# Patient Record
Sex: Female | Born: 1975 | Race: Black or African American | Hispanic: No | Marital: Married
Health system: Southern US, Community
[De-identification: ages and names within clinical notes are randomized; demographics above are authoritative.]

---

## 2008-08-10 ENCOUNTER — Ambulatory Visit (HOSPITAL_COMMUNITY): Admission: RE | Admit: 2008-08-10 | Discharge: 2008-08-10 | Payer: Self-pay | Admitting: Obstetrics and Gynecology

## 2008-08-31 ENCOUNTER — Ambulatory Visit (HOSPITAL_COMMUNITY): Admission: RE | Admit: 2008-08-31 | Discharge: 2008-08-31 | Payer: Self-pay | Admitting: Obstetrics and Gynecology

## 2008-09-28 ENCOUNTER — Ambulatory Visit (HOSPITAL_COMMUNITY): Admission: RE | Admit: 2008-09-28 | Discharge: 2008-09-28 | Payer: Self-pay | Admitting: Obstetrics and Gynecology

## 2008-10-27 ENCOUNTER — Ambulatory Visit (HOSPITAL_COMMUNITY): Admission: RE | Admit: 2008-10-27 | Discharge: 2008-10-27 | Payer: Self-pay | Admitting: Obstetrics and Gynecology

## 2008-11-24 ENCOUNTER — Ambulatory Visit (HOSPITAL_COMMUNITY): Admission: RE | Admit: 2008-11-24 | Discharge: 2008-11-24 | Payer: Self-pay | Admitting: Obstetrics and Gynecology

## 2010-08-23 IMAGING — US US OB FOLLOW-UP
1 series · 14 of 28 positions shown · non-contrast
Comparison: none

OBSTETRICAL ULTRASOUND:
 This ultrasound was performed in The [HOSPITAL], and the AS OB/GYN report will be stored to [REDACTED] PACS.

[Series 1: us ob follow-up · 14 of 50 slices shown]
[im 2/50]
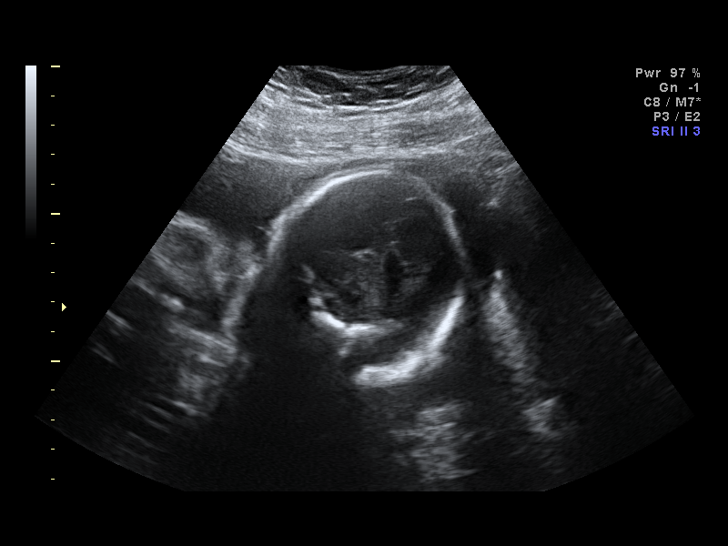
[im 6/50]
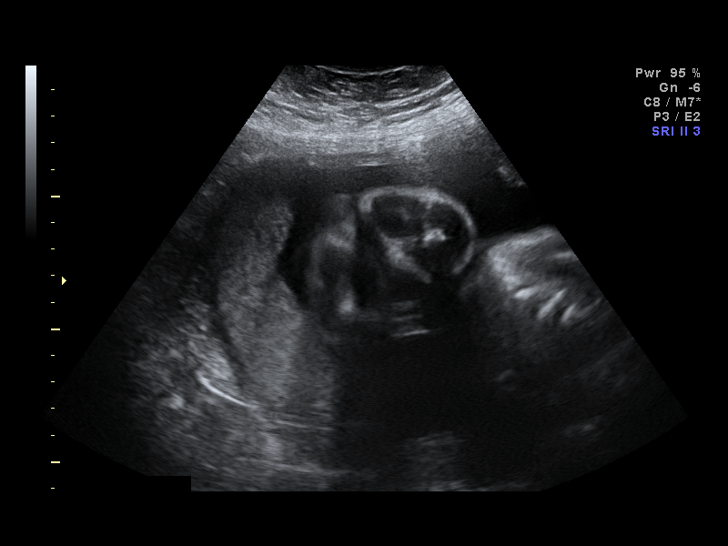
[im 10/50]
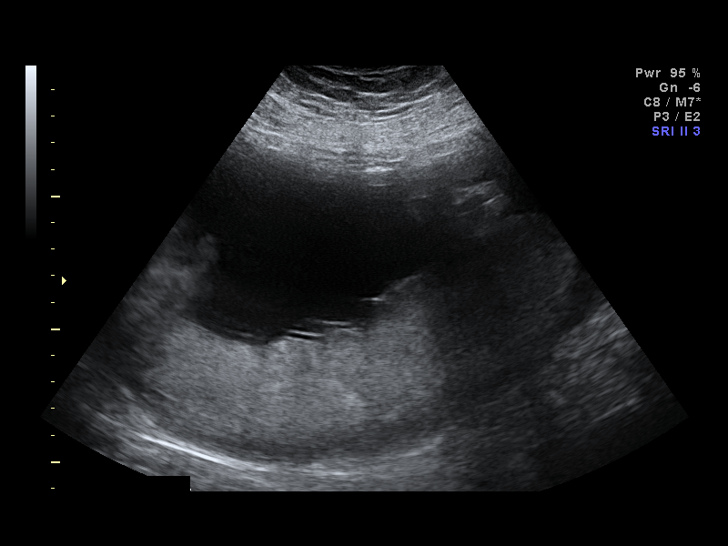
[im 13/50]
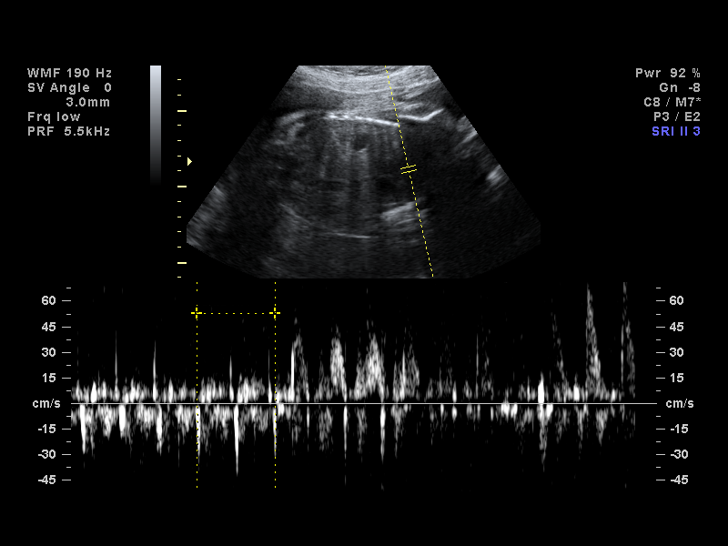
[im 17/50]
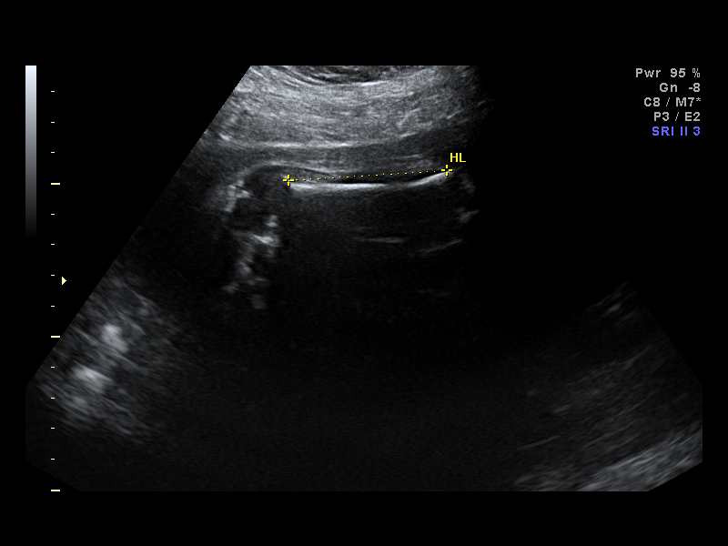
[im 20/50]
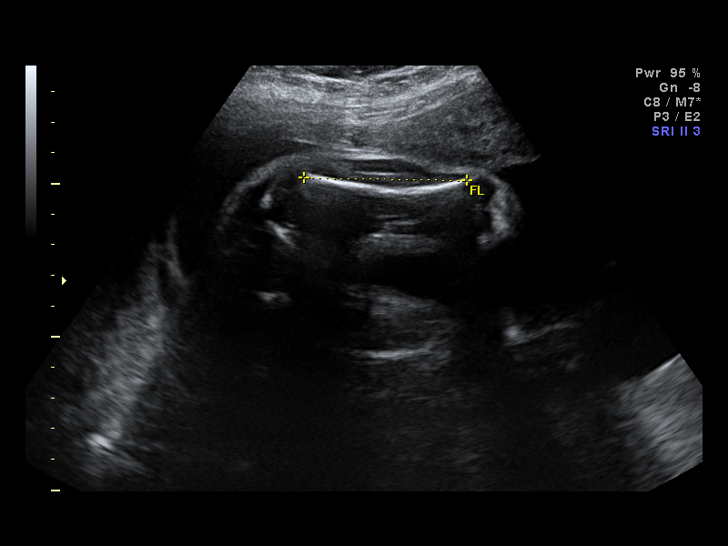
[im 24/50]
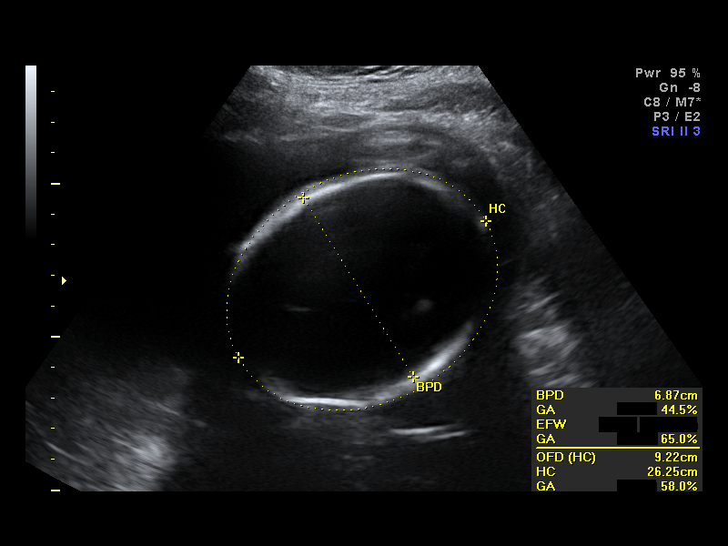
[im 28/50]
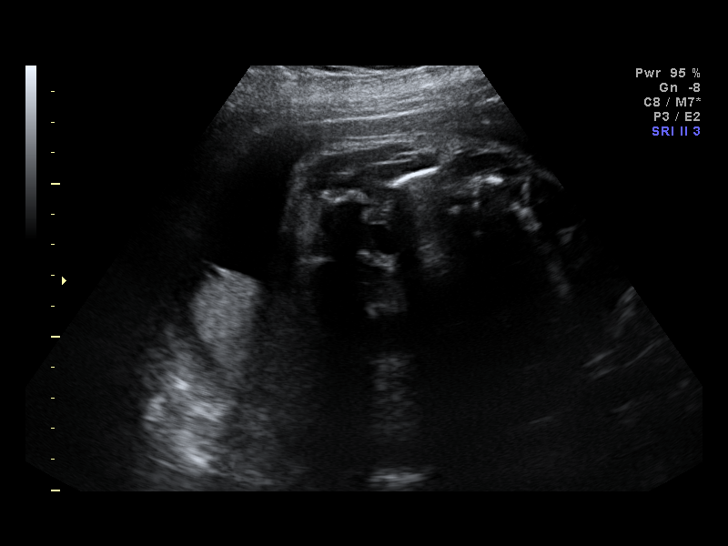
[im 31/50]
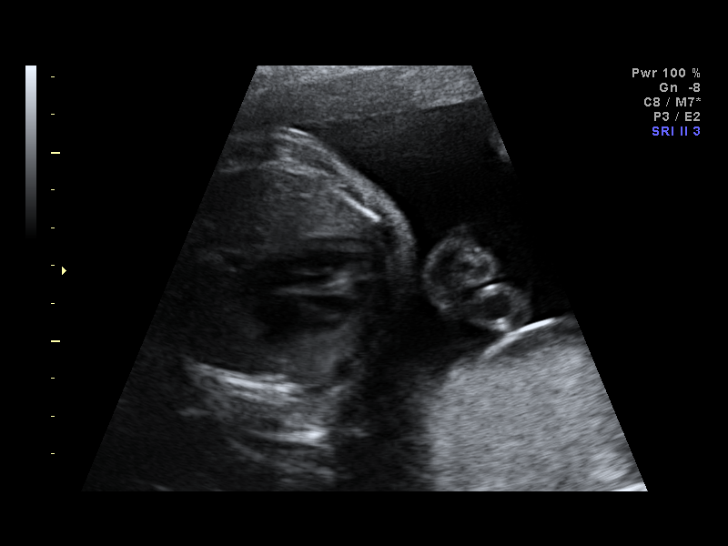
[im 35/50]
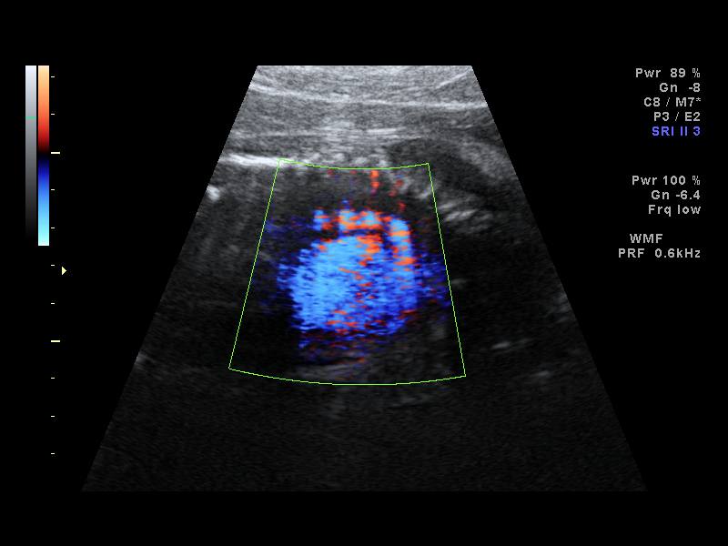
[im 39/50]
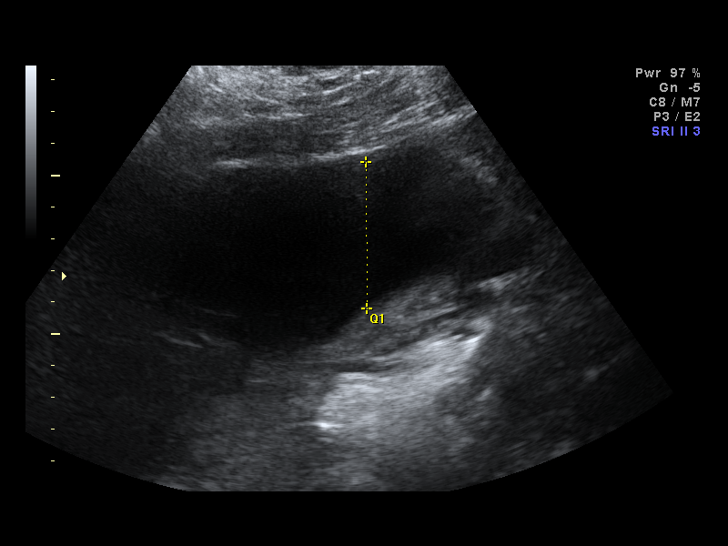
[im 42/50]
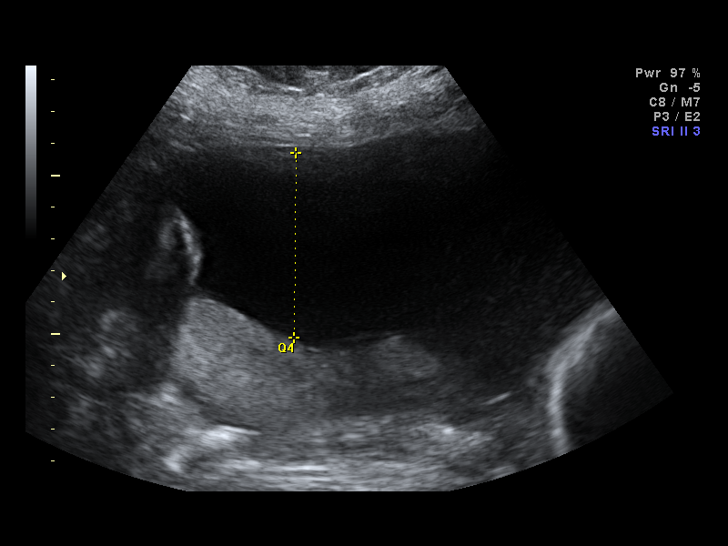
[im 46/50]
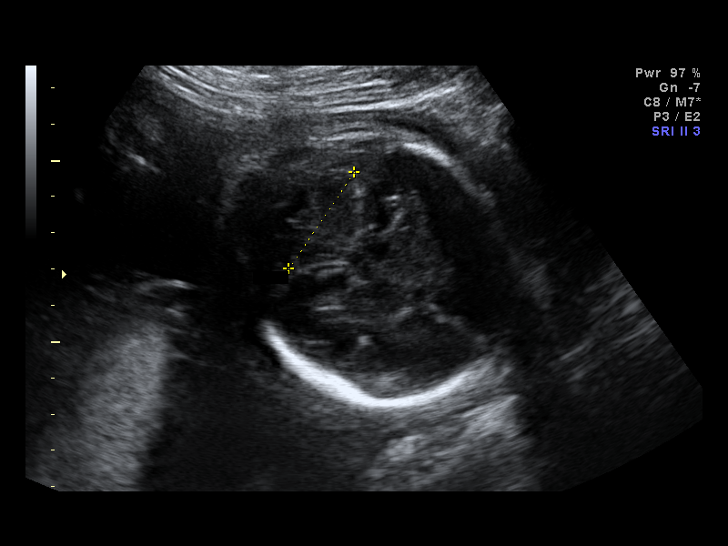
[im 50/50]
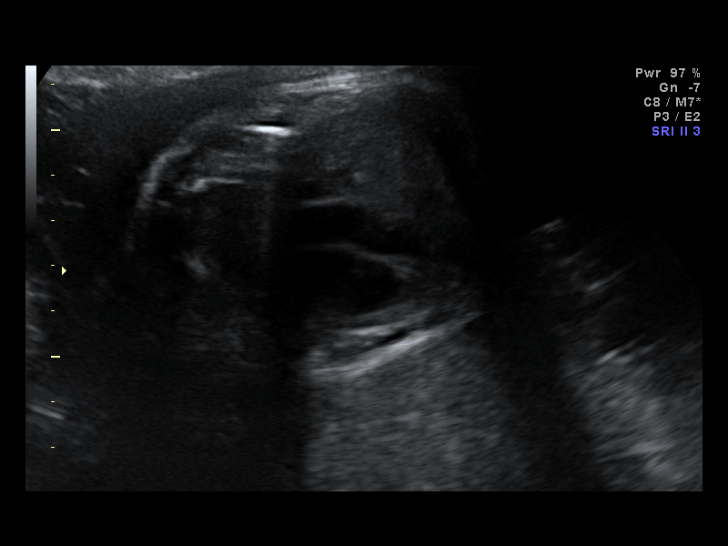

[14 of 28 positions shown; findings below may reference images not displayed]

IMPRESSION: AS OB/GYN has also been faxed to the ordering physician.

## 2021-07-09 ENCOUNTER — Inpatient Hospital Stay (HOSPITAL_COMMUNITY): Payer: Medicaid Other

## 2021-07-09 ENCOUNTER — Emergency Department (HOSPITAL_COMMUNITY): Payer: Medicaid Other

## 2021-07-09 ENCOUNTER — Inpatient Hospital Stay (HOSPITAL_COMMUNITY)
Admission: EM | Admit: 2021-07-09 | Discharge: 2021-07-21 | DRG: 023 | Disposition: E | Payer: Medicaid Other | Attending: Neurology | Admitting: Neurology

## 2021-07-09 ENCOUNTER — Inpatient Hospital Stay: Payer: Self-pay

## 2021-07-09 DIAGNOSIS — I10 Essential (primary) hypertension: Secondary | ICD-10-CM

## 2021-07-09 DIAGNOSIS — G935 Compression of brain: Secondary | ICD-10-CM | POA: Diagnosis present

## 2021-07-09 DIAGNOSIS — R29731 NIHSS score 31: Secondary | ICD-10-CM | POA: Diagnosis present

## 2021-07-09 DIAGNOSIS — R4701 Aphasia: Secondary | ICD-10-CM | POA: Diagnosis present

## 2021-07-09 DIAGNOSIS — E876 Hypokalemia: Secondary | ICD-10-CM | POA: Diagnosis present

## 2021-07-09 DIAGNOSIS — G9349 Other encephalopathy: Secondary | ICD-10-CM | POA: Diagnosis present

## 2021-07-09 DIAGNOSIS — G932 Benign intracranial hypertension: Secondary | ICD-10-CM | POA: Diagnosis present

## 2021-07-09 DIAGNOSIS — I119 Hypertensive heart disease without heart failure: Secondary | ICD-10-CM | POA: Diagnosis present

## 2021-07-09 DIAGNOSIS — I610 Nontraumatic intracerebral hemorrhage in hemisphere, subcortical: Secondary | ICD-10-CM | POA: Diagnosis present

## 2021-07-09 DIAGNOSIS — Z66 Do not resuscitate: Secondary | ICD-10-CM | POA: Diagnosis not present

## 2021-07-09 DIAGNOSIS — E1165 Type 2 diabetes mellitus with hyperglycemia: Secondary | ICD-10-CM | POA: Diagnosis not present

## 2021-07-09 DIAGNOSIS — E781 Pure hyperglyceridemia: Secondary | ICD-10-CM | POA: Diagnosis present

## 2021-07-09 DIAGNOSIS — R4182 Altered mental status, unspecified: Secondary | ICD-10-CM

## 2021-07-09 DIAGNOSIS — E785 Hyperlipidemia, unspecified: Secondary | ICD-10-CM | POA: Diagnosis present

## 2021-07-09 DIAGNOSIS — J9690 Respiratory failure, unspecified, unspecified whether with hypoxia or hypercapnia: Secondary | ICD-10-CM | POA: Diagnosis present

## 2021-07-09 DIAGNOSIS — I639 Cerebral infarction, unspecified: Secondary | ICD-10-CM

## 2021-07-09 DIAGNOSIS — R414 Neurologic neglect syndrome: Secondary | ICD-10-CM | POA: Diagnosis present

## 2021-07-09 DIAGNOSIS — I161 Hypertensive emergency: Secondary | ICD-10-CM | POA: Diagnosis present

## 2021-07-09 DIAGNOSIS — Z515 Encounter for palliative care: Secondary | ICD-10-CM | POA: Diagnosis not present

## 2021-07-09 DIAGNOSIS — G8194 Hemiplegia, unspecified affecting left nondominant side: Secondary | ICD-10-CM | POA: Diagnosis present

## 2021-07-09 DIAGNOSIS — Z0189 Encounter for other specified special examinations: Secondary | ICD-10-CM

## 2021-07-09 DIAGNOSIS — I619 Nontraumatic intracerebral hemorrhage, unspecified: Secondary | ICD-10-CM | POA: Insufficient documentation

## 2021-07-09 DIAGNOSIS — G936 Cerebral edema: Secondary | ICD-10-CM | POA: Diagnosis present

## 2021-07-09 DIAGNOSIS — N179 Acute kidney failure, unspecified: Secondary | ICD-10-CM | POA: Diagnosis not present

## 2021-07-09 DIAGNOSIS — G9382 Brain death: Secondary | ICD-10-CM | POA: Diagnosis not present

## 2021-07-09 DIAGNOSIS — D72829 Elevated white blood cell count, unspecified: Secondary | ICD-10-CM | POA: Diagnosis present

## 2021-07-09 DIAGNOSIS — E1169 Type 2 diabetes mellitus with other specified complication: Secondary | ICD-10-CM

## 2021-07-09 DIAGNOSIS — R739 Hyperglycemia, unspecified: Secondary | ICD-10-CM

## 2021-07-09 DIAGNOSIS — R29810 Facial weakness: Secondary | ICD-10-CM | POA: Diagnosis present

## 2021-07-09 DIAGNOSIS — J9601 Acute respiratory failure with hypoxia: Secondary | ICD-10-CM | POA: Diagnosis present

## 2021-07-09 DIAGNOSIS — I615 Nontraumatic intracerebral hemorrhage, intraventricular: Secondary | ICD-10-CM | POA: Diagnosis present

## 2021-07-09 DIAGNOSIS — E870 Hyperosmolality and hypernatremia: Secondary | ICD-10-CM | POA: Diagnosis present

## 2021-07-09 DIAGNOSIS — G9340 Encephalopathy, unspecified: Secondary | ICD-10-CM

## 2021-07-09 DIAGNOSIS — Z6836 Body mass index (BMI) 36.0-36.9, adult: Secondary | ICD-10-CM | POA: Diagnosis not present

## 2021-07-09 DIAGNOSIS — Z7982 Long term (current) use of aspirin: Secondary | ICD-10-CM

## 2021-07-09 DIAGNOSIS — I169 Hypertensive crisis, unspecified: Secondary | ICD-10-CM

## 2021-07-09 LAB — GLUCOSE, CAPILLARY
Glucose-Capillary: 208 mg/dL — ABNORMAL HIGH (ref 70–99)
Glucose-Capillary: 280 mg/dL — ABNORMAL HIGH (ref 70–99)
Glucose-Capillary: 283 mg/dL — ABNORMAL HIGH (ref 70–99)
Glucose-Capillary: 253 mg/dL — ABNORMAL HIGH (ref 70–99)

## 2021-07-09 LAB — COMPREHENSIVE METABOLIC PANEL
ALT: 22 U/L (ref 0–44)
AST: 25 U/L (ref 15–41)
Albumin: 4.1 g/dL (ref 3.5–5.0)
Alkaline Phosphatase: 70 U/L (ref 38–126)
Anion gap: 16 — ABNORMAL HIGH (ref 5–15)
BUN: 12 mg/dL (ref 6–20)
CO2: 23 mmol/L (ref 22–32)
Calcium: 9.4 mg/dL (ref 8.9–10.3)
Chloride: 100 mmol/L (ref 98–111)
Creatinine, Ser: 0.86 mg/dL (ref 0.44–1.00)
GFR, Estimated: >60 mL/min (ref >60)
Glucose, Bld: 213 mg/dL — ABNORMAL HIGH (ref 70–99)
Potassium: 3.4 mmol/L — ABNORMAL LOW (ref 3.5–5.1)
Sodium: 139 mmol/L (ref 135–145)
Total Bilirubin: 0.8 mg/dL (ref 0.3–1.2)
Total Protein: 7.7 g/dL (ref 6.5–8.1)

## 2021-07-09 LAB — I-STAT CHEM 8, ED
BUN: 13 mg/dL (ref 6–20)
Calcium, Ion: 1.2 mmol/L (ref 1.15–1.40)
Chloride: 103 mmol/L (ref 98–111)
Creatinine, Ser: 0.7 mg/dL (ref 0.44–1.00)
Glucose, Bld: 212 mg/dL — ABNORMAL HIGH (ref 70–99)
HCT: 41 % (ref 36.0–46.0)
Hemoglobin: 13.9 g/dL (ref 12.0–15.0)
Potassium: 3.4 mmol/L — ABNORMAL LOW (ref 3.5–5.1)
Sodium: 140 mmol/L (ref 135–145)
TCO2: 26 mmol/L (ref 22–32)

## 2021-07-09 LAB — DIFFERENTIAL
Abs Immature Granulocytes: 0.4 10*3/uL — ABNORMAL HIGH (ref 0.00–0.07)
Basophils Absolute: 0.1 10*3/uL (ref 0.0–0.1)
Basophils Relative: 1 %
Eosinophils Absolute: 0.1 10*3/uL (ref 0.0–0.5)
Eosinophils Relative: 1 %
Immature Granulocytes: 3 %
Lymphocytes Relative: 30 %
Lymphs Abs: 4.5 10*3/uL — ABNORMAL HIGH (ref 0.7–4.0)
Monocytes Absolute: 1 10*3/uL (ref 0.1–1.0)
Monocytes Relative: 7 %
Neutro Abs: 8.7 10*3/uL — ABNORMAL HIGH (ref 1.7–7.7)
Neutrophils Relative %: 58 %

## 2021-07-09 LAB — URINALYSIS, ROUTINE W REFLEX MICROSCOPIC
Bilirubin Urine: NEGATIVE
Glucose, UA: 250 mg/dL — AB
Hgb urine dipstick: NEGATIVE
Ketones, ur: 15 mg/dL — AB
Leukocytes,Ua: NEGATIVE
Nitrite: NEGATIVE
Protein, ur: 100 mg/dL — AB
Specific Gravity, Urine: 1.01 (ref 1.005–1.030)
pH: 5.5 (ref 5.0–8.0)

## 2021-07-09 LAB — URINALYSIS, MICROSCOPIC (REFLEX)

## 2021-07-09 LAB — RAPID URINE DRUG SCREEN, HOSP PERFORMED
Amphetamines: NOT DETECTED
Barbiturates: NOT DETECTED
Benzodiazepines: NOT DETECTED
Cocaine: NOT DETECTED
Opiates: NOT DETECTED
Tetrahydrocannabinol: NOT DETECTED

## 2021-07-09 LAB — POCT I-STAT 7, (LYTES, BLD GAS, ICA,H+H)
Acid-Base Excess: 4 mmol/L — ABNORMAL HIGH (ref 0.0–2.0)
Bicarbonate: 29.6 mmol/L — ABNORMAL HIGH (ref 20.0–28.0)
Calcium, Ion: 1.17 mmol/L (ref 1.15–1.40)
HCT: 36 % (ref 36.0–46.0)
Hemoglobin: 12.2 g/dL (ref 12.0–15.0)
O2 Saturation: 100 %
Patient temperature: 98.5
Potassium: 3.6 mmol/L (ref 3.5–5.1)
Sodium: 141 mmol/L (ref 135–145)
TCO2: 31 mmol/L (ref 22–32)
pCO2 arterial: 46.4 mmHg (ref 32.0–48.0)
pH, Arterial: 7.413 (ref 7.350–7.450)
pO2, Arterial: 195 mmHg — ABNORMAL HIGH (ref 83.0–108.0)

## 2021-07-09 LAB — I-STAT BETA HCG BLOOD, ED (MC, WL, AP ONLY): I-stat hCG, quantitative: <5 m[IU]/mL (ref <5)

## 2021-07-09 LAB — CBC
HCT: 42.1 % (ref 36.0–46.0)
Hemoglobin: 12.9 g/dL (ref 12.0–15.0)
MCH: 25.2 pg — ABNORMAL LOW (ref 26.0–34.0)
MCHC: 30.6 g/dL (ref 30.0–36.0)
MCV: 82.2 fL (ref 80.0–100.0)
Platelets: 323 10*3/uL (ref 150–400)
RBC: 5.12 MIL/uL — ABNORMAL HIGH (ref 3.87–5.11)
RDW: 15 % (ref 11.5–15.5)
WBC: 14.7 10*3/uL — ABNORMAL HIGH (ref 4.0–10.5)
nRBC: 0 % (ref 0.0–0.2)

## 2021-07-09 LAB — PHOSPHORUS: Phosphorus: 3.3 mg/dL (ref 2.5–4.6)

## 2021-07-09 LAB — SODIUM
Sodium: 139 mmol/L (ref 135–145)
Sodium: 133 mmol/L — ABNORMAL LOW (ref 135–145)
Sodium: 141 mmol/L (ref 135–145)

## 2021-07-09 LAB — TYPE AND SCREEN
ABO/RH(D): O POS
Antibody Screen: NEGATIVE

## 2021-07-09 LAB — LIPID PANEL
Cholesterol: 208 mg/dL — ABNORMAL HIGH (ref 0–200)
HDL: 39 mg/dL — ABNORMAL LOW (ref >40)
LDL Cholesterol: UNDETERMINED mg/dL (ref 0–99)
Total CHOL/HDL Ratio: 5.3 RATIO
Triglycerides: 499 mg/dL — ABNORMAL HIGH (ref <150)
VLDL: UNDETERMINED mg/dL (ref 0–40)

## 2021-07-09 LAB — PROTIME-INR
INR: 1 (ref 0.8–1.2)
Prothrombin Time: 13.6 seconds (ref 11.4–15.2)

## 2021-07-09 LAB — HEMOGLOBIN A1C
Hgb A1c MFr Bld: 8.2 % — ABNORMAL HIGH (ref 4.8–5.6)
Mean Plasma Glucose: 188.64 mg/dL

## 2021-07-09 LAB — HIV ANTIBODY (ROUTINE TESTING W REFLEX): HIV Screen 4th Generation wRfx: NONREACTIVE

## 2021-07-09 LAB — APTT: aPTT: 23 seconds — ABNORMAL LOW (ref 24–36)

## 2021-07-09 LAB — ABO/RH: ABO/RH(D): O POS

## 2021-07-09 LAB — LDL CHOLESTEROL, DIRECT: Direct LDL: 134 mg/dL — ABNORMAL HIGH (ref 0–99)

## 2021-07-09 LAB — PREGNANCY, URINE: Preg Test, Ur: NEGATIVE

## 2021-07-09 LAB — MRSA NEXT GEN BY PCR, NASAL: MRSA by PCR Next Gen: NOT DETECTED

## 2021-07-09 LAB — ETHANOL: Alcohol, Ethyl (B): <10 mg/dL (ref <10)

## 2021-07-09 LAB — MAGNESIUM: Magnesium: 2.1 mg/dL (ref 1.7–2.4)

## 2021-07-09 LAB — OSMOLALITY: Osmolality: 304 mOsm/kg — ABNORMAL HIGH (ref 275–295)

## 2021-07-09 MED ORDER — SUCCINYLCHOLINE CHLORIDE 20 MG/ML IJ SOLN
INTRAMUSCULAR | Status: DC | PRN
  Administered 2021-07-09: 160 mg via INTRAVENOUS

## 2021-07-09 MED ORDER — SODIUM CHLORIDE 0.9% FLUSH: 10.0000 mL | INTRAVENOUS | Status: DC | PRN

## 2021-07-09 MED ORDER — PROPOFOL 1000 MG/100ML IV EMUL
0.0000 ug/kg/min | INTRAVENOUS | Status: DC
  Filled 2021-07-09: qty 100

## 2021-07-09 MED ORDER — INSULIN REGULAR(HUMAN) IN NACL 100-0.9 UT/100ML-% IV SOLN
INTRAVENOUS | Status: DC
  Administered 2021-07-09: 7 [IU]/h via INTRAVENOUS
  Administered 2021-07-10: 6.5 [IU]/h via INTRAVENOUS
  Filled 2021-07-09 (×2): qty 100

## 2021-07-09 MED ORDER — FENTANYL BOLUS VIA INFUSION
50.0000 ug | INTRAVENOUS | Status: DC | PRN
  Administered 2021-07-12: 50 ug via INTRAVENOUS
  Filled 2021-07-09: qty 100

## 2021-07-09 MED ORDER — CLEVIDIPINE 50 MG/100ML IV EMUL
0.0000 mg/h | INTRAVENOUS | Status: DC
  Administered 2021-07-09 (×4): 32 mg/h via INTRAVENOUS
  Filled 2021-07-09 (×5): qty 100
  Filled 2021-07-09: qty 200
  Filled 2021-07-09 (×2): qty 100

## 2021-07-09 MED ORDER — NICARDIPINE HCL IN NACL 20-0.86 MG/200ML-% IV SOLN: 0.0000 mg/h | INTRAVENOUS | Status: DC

## 2021-07-09 MED ORDER — PROPOFOL BOLUS VIA INFUSION
50.0000 mg | Freq: Once | INTRAVENOUS | Status: AC
  Administered 2021-07-09: 50 mg via INTRAVENOUS

## 2021-07-09 MED ORDER — FENTANYL CITRATE PF 50 MCG/ML IJ SOSY: 50.0000 ug | PREFILLED_SYRINGE | Freq: Once | INTRAMUSCULAR | Status: DC

## 2021-07-09 MED ORDER — INSULIN ASPART 100 UNIT/ML IJ SOLN
2.0000 [IU] | INTRAMUSCULAR | Status: DC
  Administered 2021-07-09: 6 [IU] via SUBCUTANEOUS

## 2021-07-09 MED ORDER — ACETAMINOPHEN 650 MG RE SUPP: 650.0000 mg | RECTAL | Status: DC | PRN

## 2021-07-09 MED ORDER — SODIUM CHLORIDE 0.9% FLUSH
10.0000 mL | Freq: Two times a day (BID) | INTRAVENOUS | Status: DC
  Administered 2021-07-09 – 2021-07-10 (×2): 10 mL
  Administered 2021-07-10 – 2021-07-11 (×3): 20 mL
  Administered 2021-07-13: 10 mL

## 2021-07-09 MED ORDER — SODIUM CHLORIDE 3 % IV BOLUS
250.0000 mL | Freq: Once | INTRAVENOUS | Status: AC
  Administered 2021-07-09: 250 mL via INTRAVENOUS
  Filled 2021-07-09: qty 500

## 2021-07-09 MED ORDER — ORAL CARE MOUTH RINSE
15.0000 mL | OROMUCOSAL | Status: DC
  Administered 2021-07-09 – 2021-07-13 (×34): 15 mL via OROMUCOSAL

## 2021-07-09 MED ORDER — SODIUM CHLORIDE 3 % IV SOLN
INTRAVENOUS | Status: DC
  Filled 2021-07-09 (×4): qty 500

## 2021-07-09 MED ORDER — FENTANYL 2500MCG IN NS 250ML (10MCG/ML) PREMIX INFUSION
50.0000 ug/h | INTRAVENOUS | Status: DC
  Administered 2021-07-09: 50 ug/h via INTRAVENOUS
  Administered 2021-07-10 – 2021-07-12 (×2): 100 ug/h via INTRAVENOUS
  Filled 2021-07-09 (×5): qty 250

## 2021-07-09 MED ORDER — LABETALOL HCL 5 MG/ML IV SOLN: 20.0000 mg | INTRAVENOUS | Status: DC | PRN

## 2021-07-09 MED ORDER — CLEVIDIPINE BUTYRATE 0.5 MG/ML IV EMUL
0.0000 mg/h | INTRAVENOUS | Status: DC
  Administered 2021-07-09: 1 mg/h via INTRAVENOUS
  Filled 2021-07-09: qty 50

## 2021-07-09 MED ORDER — SENNOSIDES-DOCUSATE SODIUM 8.6-50 MG PO TABS: 1.0000 | ORAL_TABLET | Freq: Two times a day (BID) | ORAL | Status: DC

## 2021-07-09 MED ORDER — STROKE: EARLY STAGES OF RECOVERY BOOK
Freq: Once | Status: AC
Start: 2021-07-09 — End: 2021-07-09
  Filled 2021-07-09 (×2): qty 1

## 2021-07-09 MED ORDER — LABETALOL HCL 5 MG/ML IV SOLN
INTRAVENOUS | Status: AC
  Administered 2021-07-09: 10 mg via INTRAVENOUS
  Filled 2021-07-09: qty 4

## 2021-07-09 MED ORDER — CLEVIDIPINE BUTYRATE 0.5 MG/ML IV EMUL
0.0000 mg/h | INTRAVENOUS | Status: DC
  Administered 2021-07-09: 32 mg/h via INTRAVENOUS
  Administered 2021-07-10: 18 mg/h via INTRAVENOUS
  Administered 2021-07-10 (×3): 32 mg/h via INTRAVENOUS
  Administered 2021-07-10: 30 mg/h via INTRAVENOUS
  Administered 2021-07-10 – 2021-07-11 (×3): 32 mg/h via INTRAVENOUS
  Administered 2021-07-11: 26 mg/h via INTRAVENOUS
  Administered 2021-07-11 (×2): 32 mg/h via INTRAVENOUS
  Administered 2021-07-11: 1 mg/h via INTRAVENOUS
  Administered 2021-07-11 (×3): 32 mg/h via INTRAVENOUS
  Administered 2021-07-12: 18 mg/h via INTRAVENOUS
  Administered 2021-07-12 (×3): 32 mg/h via INTRAVENOUS
  Administered 2021-07-12: 30 mg/h via INTRAVENOUS
  Administered 2021-07-12: 26 mg/h via INTRAVENOUS
  Administered 2021-07-12 – 2021-07-13 (×4): 32 mg/h via INTRAVENOUS
  Administered 2021-07-13: 10 mg/h via INTRAVENOUS
  Administered 2021-07-13: 32 mg/h via INTRAVENOUS
  Filled 2021-07-09 (×20): qty 100
  Filled 2021-07-09: qty 200
  Filled 2021-07-09 (×12): qty 100

## 2021-07-09 MED ORDER — POLYETHYLENE GLYCOL 3350 17 G PO PACK
17.0000 g | PACK | Freq: Every day | ORAL | Status: DC
  Administered 2021-07-10 – 2021-07-13 (×4): 17 g
  Filled 2021-07-09 (×4): qty 1

## 2021-07-09 MED ORDER — DOCUSATE SODIUM 50 MG/5ML PO LIQD
100.0000 mg | Freq: Two times a day (BID) | ORAL | Status: DC
  Administered 2021-07-10 – 2021-07-13 (×6): 100 mg
  Filled 2021-07-09 (×6): qty 10

## 2021-07-09 MED ORDER — PROPOFOL BOLUS VIA INFUSION
40.0000 mg | Freq: Once | INTRAVENOUS | Status: AC
  Administered 2021-07-09: 40 mg via INTRAVENOUS
  Filled 2021-07-09: qty 40

## 2021-07-09 MED ORDER — MANNITOL 20 % IV SOLN
80.0000 g | Status: AC
  Administered 2021-07-09: 80 g via INTRAVENOUS
  Filled 2021-07-09 (×2): qty 400

## 2021-07-09 MED ORDER — HYDRALAZINE HCL 20 MG/ML IJ SOLN: 10.0000 mg | INTRAMUSCULAR | Status: DC | PRN

## 2021-07-09 MED ORDER — ETOMIDATE 2 MG/ML IV SOLN
INTRAVENOUS | Status: DC | PRN
  Administered 2021-07-09: 30 mg via INTRAVENOUS

## 2021-07-09 MED ORDER — LABETALOL HCL 5 MG/ML IV SOLN
10.0000 mg | INTRAVENOUS | Status: DC | PRN
  Administered 2021-07-09 – 2021-07-11 (×10): 10 mg via INTRAVENOUS
  Filled 2021-07-09 (×10): qty 4

## 2021-07-09 MED ORDER — ACETAMINOPHEN 160 MG/5ML PO SOLN
650.0000 mg | ORAL | Status: DC | PRN
  Administered 2021-07-10 – 2021-07-12 (×6): 650 mg
  Filled 2021-07-09 (×6): qty 20.3

## 2021-07-09 MED ORDER — SODIUM CHLORIDE 0.9% FLUSH: 3.0000 mL | Freq: Once | INTRAVENOUS | Status: DC

## 2021-07-09 MED ORDER — CHLORHEXIDINE GLUCONATE 0.12% ORAL RINSE (MEDLINE KIT)
15.0000 mL | Freq: Two times a day (BID) | OROMUCOSAL | Status: DC
  Administered 2021-07-09 – 2021-07-13 (×8): 15 mL via OROMUCOSAL

## 2021-07-09 MED ORDER — ACETAMINOPHEN 325 MG PO TABS: 650.0000 mg | ORAL_TABLET | ORAL | Status: DC | PRN

## 2021-07-09 MED ORDER — PROPOFOL 1000 MG/100ML IV EMUL
INTRAVENOUS | Status: DC | PRN
  Administered 2021-07-09: 25 ug/kg/min via INTRAVENOUS

## 2021-07-09 MED ORDER — POTASSIUM CHLORIDE 20 MEQ PO PACK: 40.0000 meq | PACK | Freq: Every day | ORAL | Status: DC

## 2021-07-09 MED ORDER — FENTANYL CITRATE (PF) 100 MCG/2ML IJ SOLN
50.0000 ug | Freq: Once | INTRAMUSCULAR | Status: AC
Start: 2021-07-09 — End: 2021-07-09
  Filled 2021-07-09: qty 1

## 2021-07-09 MED ORDER — LEVETIRACETAM IN NACL 1000 MG/100ML IV SOLN
1000.0000 mg | Freq: Two times a day (BID) | INTRAVENOUS | Status: DC
  Administered 2021-07-09 – 2021-07-10 (×3): 1000 mg via INTRAVENOUS
  Filled 2021-07-09 (×3): qty 100

## 2021-07-09 MED ORDER — PANTOPRAZOLE SODIUM 40 MG IV SOLR
40.0000 mg | Freq: Every day | INTRAVENOUS | Status: DC
  Administered 2021-07-09: 40 mg via INTRAVENOUS
  Filled 2021-07-09: qty 40

## 2021-07-09 MED ORDER — SODIUM CHLORIDE 3 % IV SOLN
INTRAVENOUS | Status: DC
  Filled 2021-07-09 (×3): qty 500

## 2021-07-09 MED ORDER — CHLORHEXIDINE GLUCONATE CLOTH 2 % EX PADS
6.0000 | MEDICATED_PAD | Freq: Every day | CUTANEOUS | Status: DC
  Administered 2021-07-09 – 2021-07-12 (×3): 6 via TOPICAL

## 2021-07-09 MED ORDER — DEXTROSE 50 % IV SOLN: 0.0000 mL | INTRAVENOUS | Status: DC | PRN

## 2021-07-09 MED ORDER — IOHEXOL 350 MG/ML SOLN
75.0000 mL | Freq: Once | INTRAVENOUS | Status: AC | PRN
  Administered 2021-07-09: 75 mL via INTRAVENOUS

## 2021-07-09 MED ORDER — SENNOSIDES-DOCUSATE SODIUM 8.6-50 MG PO TABS
1.0000 | ORAL_TABLET | Freq: Two times a day (BID) | ORAL | Status: DC
  Administered 2021-07-10 – 2021-07-13 (×6): 1
  Filled 2021-07-09 (×7): qty 1

## 2021-07-09 MED ORDER — POTASSIUM CHLORIDE 20 MEQ PO PACK
40.0000 meq | PACK | Freq: Every day | ORAL | Status: DC
  Administered 2021-07-09 – 2021-07-13 (×5): 40 meq
  Filled 2021-07-09 (×5): qty 2

## 2021-07-09 MED ORDER — INSULIN ASPART 100 UNIT/ML IJ SOLN: 0.0000 [IU] | INTRAMUSCULAR | Status: DC

## 2021-07-09 NOTE — Plan of Care (Signed)
Neurology  NP spoke with PCCM about vent management.  NP spoke with NSU: No need for EVD at this time, they will follow.   Jimmye Norman, MSN, APN-BC Neurology Nurse Practitioner Pager [phone]

## 2021-07-09 NOTE — Progress Notes (Signed)
Patient transported to CT scan and back to 4N15 without incidence. 

## 2021-07-09 NOTE — Progress Notes (Signed)
Peripherally Inserted Central Catheter Placement  The IV Nurse has discussed with the patient and/or persons authorized to consent for the patient, the purpose of this procedure and the potential benefits and risks involved with this procedure.  The benefits include less needle sticks, lab draws from the catheter, and the patient may be discharged home with the catheter. Risks include, but not limited to, infection, bleeding, blood clot (thrombus formation), and puncture of an artery; nerve damage and irregular heartbeat and possibility to perform a PICC exchange if needed/ordered by physician.  Alternatives to this procedure were also discussed.  Bard Power PICC patient education guide, fact sheet on infection prevention and patient information card has been provided to patient /or left at bedside.    PICC Placement Documentation  PICC Triple Lumen July 16, 2021 PICC Right Brachial 41 cm 0 cm (Active)  Indication for Insertion or Continuance of Line Vasoactive infusions 16-Jul-2021 1734  Exposed Catheter (cm) 0 cm 16-Jul-2021 1734  Site Assessment Clean;Dry;Intact 07/16/2021 1734  Dressing Change Due 07/16/21 16-Jul-2021 1734     PICC consent obtained by husband by Curt Jews RN and Tally Joe RN  Dewain Penning M Jul 16, 2021, 5:35 PM

## 2021-07-09 NOTE — Progress Notes (Signed)
Pt. Arterial blood gas as follows: PH 7.41 PCO2 46.5 PO2 195 HCO3 29.6 TCO2 31

## 2021-07-09 NOTE — H&P (Signed)
Neurology H&P  Katherine Hardin MR# [id number] 07/20/2021  CC: unresponsive, aphasia, HA, facial droop and high BP.   History is obtained from:EMS.   HPI: Katherine Hardin is a 45 y.o. female with an unknown PMHx as only note in Epic is a delivery 10 years ago. Patient was brought in by EMS as a code stroke. Patient was working at [employer] today, when at 44, a co worker witnessed her [place] over. Patient had aphasia, facial droop and left neglect with weakness. BP 290/150. CBG 211. She vomited in route as well and was unresponsive when arrived. After brief exam at the ED bridge, patient was taken to ED 8 and intubated emergently. Propofol was started after intubation. Cleviprex was started after bleed confirmed.   CTH with 72 cm volume acute IPH with epicenter in the right basal ganglia. Intraventricular penetration in to the rifght lateral ventricle wth exension to 3rd and 4th ventricles. Shift 57m.   Afterwards, NP met with husband in conference room. Informed husband of event, ventilator, and ICU stay. Informed that patient is very sick, and next 7 days will be crucial.   Per husband patient has no PCP. He knows of no history of HTN, but she does have HAs and takes Excedrin Migraine. No known DM II. No prior stroke, brain surgery, cardiac surgery/stents. Her only surgical procedures were CS x 2, 17 and 12 years ago. Patient takes ASA 880mpo qd and Tylenol prn. Husband states patient has been under a lot of stress at work recently.   LKW: 1210 TNK given?: No, ICH IR Thrombectomy? No, ICH MRS 0  NIHSS unable to obtain due to emergency event with intubation and finding of IPH. Patient paralyzed for intubation.   ROS: Unable to obtain due to altered mental status.   PMHx: HAs  No FMHx of stroke.   Social History: No smoking, ETOH or illicit drugs.    Prior to Admission medications   ASA 8118mo qd Excedrin Migraine prn Tylenol prn    Exam: Current vital signs: BP (!) 173/139    Pulse (!) 115   Resp 19   SpO2 100%   Physical Exam  Obese female, unresponsive, who appears acutely ill.  Psych: unresponsive on arrival, then intubated.  Eyes: No scleral injection HENT: No OP obstrucion Head: Normocephalic.  Cardiovascular: Bradycardic.  Respiratory: Inability to protect airway with n/v. Patient intubated.  GI: Soft.  No distension. There is no tenderness.  Skin: WDI.  Neuro: Mental Status: Patient is unresponsive. Does not follow commands.  Patient is unable to give a clear and coherent history. Speech: no sounds.  Complete neurological exam unable to be completed prior to intubation, paralytic administration, or Propofol drip.   GCS: Best motor  1     Best verbal    1      Best eye  1       Total: 3  GCS with propofol paused: motor 5      verbal 1     eye 1     Total: 7   Cranial Nerves: Propofol paused for exam in ICU.  II: No blink to threat.  III, IV, VI: disconjugate.  V, VII: Corneals +  VIII: Does not open eyes to name calling or loud clap.  IX, X: + cough XI: head midline [id number]/Sensation: She localizes to pain with RUE. Withdraws to pain in RLE. Moves LUE slightly to pain. Short flex to pain with LLE (reflex likely).  She  grimaces to noxious stimuli.   Cerebellar: No tremors, twitching, jerking, or shaking noted.   Gait: Bedrest.   I have reviewed labs in epic and the pertinent results are: Na 134, Creat 0.70, Hgb 13.9, INR 1, aPTT 23, Glucose 213, pregnancy test negative.   MD reviewed has reviewed the images obtained: CTH:  Acute infract kummel hemorrhage with the epicenter in the right basal ganglia, estimated volume 72 cc. Intraventricular penetration into the right lateral ventricle with extension to the third and fourth ventricles. Mass effect as described. CTA head and neck: Patent vasculature of the head and neck. No aneurysm or AVM identified. Overall stable large intraparenchymal hematoma centered in the right  basal ganglia with unchanged leftward midline shift. No evidence of active extravasation into the hematoma.   Assessment: 45 yo female with stroke risk factors of HTN and unknown other PMHx. Patient was intubated on arrival due to n/v and unprotected airway. BP 290. CTH showed 72cm volume IPH right basal ganglia with intraventricular penetration. CTA without aneurysm or AVM. After bleed confirmed, patient started on Clexiprex, then maxed out. Added Labetolol IV prn and BP down to 140. Patient was started on 3% hypertonic saline in the CT suite and Mannitol 20% 80 gram bolus ordered due to shift. Her later exam shows improvement in GCS with signs of cortical function. She is to have a PICC due to poor venous access.   Acuity: Acute Laterality: right.  Current suspected etiology:  HTN  Impression:  -Hypertensive IPH with intraventricular penetration.  - Hypertensive emergency - Cerebral edema - At risk for herniation - ICH Score: 3 - Volume: 72 cc - Hypokalemia - Hyperglycemia  - HTN - DM 2 - HLD  Plan:  -IPH with intraventricular penetration, hypertensive bleed.  Admit to neuro ICU. Elevate head of bed 30-45 degrees and keep head midline. Analgosedation with fentanyl. Start clevidipine drip and labetalol IV for goal SBP<140. -Mannitol 20% 80 gram bolus ordered. Start 3% Na infusion - Goal serum Na 145-155. Serum Na every 6 hours. X-ray chest. Keep platelets >100k, INR<1.4 Replete electrolytes as needed. Labs: Coags, CBC, type and cross, CMP, Mg, Phos, fasting lipids, hA1c, hCRP, troponins, urinalysis. Normothermia - Acetaminophen for temperature >37.5C Euglycemia (~ <180) Euvolemia - Strict I/Os Precautions: Airway and herniation, seizure, aspiration. NSU consulted, no need for EVD at present and they will follow.  Hold antiplatelets and anticoagulation for now. IV fluids gentle hydration. Repeat CT head in 6 hours (or sooner if clinical worsening). Echocardiogram. MRI  brain without contrast. PT/OT/ST    CNS Cerebral edema -3% hypertonic saline at 75cc/hr.  -Mannitol 20% 80 gram bolus ordered. -NSU to follow.  -monitor neuro checks.  -Stat CTH 6 hours and for any acute change in neurological status.   Aphasia SLP evaluation.  NPO.   Left hemiparesis.  -Continue PT/OT  RESP Acute Respiratory Failure  -vent management per PCCM.  -wean when able  CV Hypertensive Emergency. Tight BP control < 140.  TTE when able.    HEME -Monitor -transfuse for hgb < 7 -Trend PT/PTT/INR   ENDO  -SSI -check HbA1c. Goal < 7.   Nutrition E66.9 Obesity   Prophylaxis DVT: SCDs. GI: Protonix. Bowel: Colace.   Dispo: to be determined.   Diet: NPO until cleared by speech  Code Status: Full Code     THE FOLLOWING WERE PRESENT ON ADMISSION: HTN, GCS 3, n/v, acute respiratory failure, IPH with intraventricular hemorrhage 72cm volume. Hemiplegia.   This patient is critically ill and  at significant risk of neurological worsening, death and care requires constant monitoring of vital signs, hemodynamics,respiratory and cardiac monitoring, neurological assessment, discussion with family, other specialists and medical decision making of high complexity. I spent 73 minutes of neurocritical care time  in the care of  this patient. This was time spent independent of any time provided by nurse practitioner or PA.     Lynnae Sandhoff, MD Page: [phone]

## 2021-07-09 NOTE — Consult Note (Signed)
Reason for Consult: Intracerebral hemorrhage Referring Physician: Stroke team  Katherine Hardin is an 45 y.o. female.  HPI: 45 year old female with acute onset headache and rapid decline in consciousness.  No history of trauma.  No history of anticoagulation.  No known history of hypertension.  Work-up in [facility] emergency department has demonstrated a large right basal ganglia hypertensive hemorrhage with some minimal intraventricular extension.  CT angiogram negative for vascular abnormality.  No past medical history on file.    No family history on file.  Social History:  has no history on file for tobacco use, alcohol use, and drug use.  Allergies: Not on File  Medications: I have reviewed the patient's current medications.  Results for orders placed or performed during the hospital encounter of 2021-07-26 (from the past 48 hour(s))  Protime-INR     Status: None   Collection Time: 07/26/21 12:54 PM  Result Value Ref Range   Prothrombin Time 13.6 11.4 - 15.2 seconds   INR 1.0 0.8 - 1.2    Comment: (NOTE) INR goal varies based on device and disease states. Performed at [facility], [address], [place], [place] [address]   APTT     Status: Abnormal   Collection Time: 2021/07/26 12:54 PM  Result Value Ref Range   aPTT 23 (L) 24 - 36 seconds    Comment: Performed at [facility], [address], [place], [place] [address]  CBC     Status: Abnormal   Collection Time: 07/26/21 12:54 PM  Result Value Ref Range   WBC 14.7 (H) 4.0 - 10.5 K/uL   RBC 5.12 (H) 3.87 - 5.11 MIL/uL   Hemoglobin 12.9 12.0 - 15.0 g/dL   HCT 86.5 78.4 - 69.6 %   MCV 82.2 80.0 - 100.0 fL   MCH 25.2 (L) 26.0 - 34.0 pg   MCHC 30.6 30.0 - 36.0 g/dL   RDW 29.5 28.4 - 13.2 %   Platelets 323 150 - 400 K/uL   nRBC 0.0 0.0 - 0.2 %    Comment: Performed at [facility], [address], [place], [place] [address]  Differential     Status: Abnormal   Collection Time: 2021-07-26 12:54 PM  Result  Value Ref Range   Neutrophils Relative % 58 %   Neutro Abs 8.7 (H) 1.7 - 7.7 K/uL   Lymphocytes Relative 30 %   Lymphs Abs 4.5 (H) 0.7 - 4.0 K/uL   Monocytes Relative 7 %   Monocytes Absolute 1.0 0.1 - 1.0 K/uL   Eosinophils Relative 1 %   Eosinophils Absolute 0.1 0.0 - 0.5 K/uL   Basophils Relative 1 %   Basophils Absolute 0.1 0.0 - 0.1 K/uL   Immature Granulocytes 3 %   Abs Immature Granulocytes 0.40 (H) 0.00 - 0.07 K/uL    Comment: Performed at [facility], [address] [place], [place] [address]  Comprehensive metabolic panel     Status: Abnormal   Collection Time: 2021/07/26 12:54 PM  Result Value Ref Range   Sodium 139 135 - 145 mmol/L   Potassium 3.4 (L) 3.5 - 5.1 mmol/L   Chloride 100 98 - 111 mmol/L   CO2 23 22 - 32 mmol/L   Glucose, Bld 213 (H) 70 - 99 mg/dL    Comment: Glucose reference range applies only to samples taken after fasting for at least 8 hours.   BUN 12 6 - 20 mg/dL   Creatinine, Ser 6.64 0.44 -  1.00 mg/dL   Calcium 9.4 8.9 - 11.9 mg/dL   Total Protein 7.7 6.5 - 8.1 g/dL   Albumin 4.1 3.5 - 5.0 g/dL   AST 25 15 - 41 U/L   ALT 22 0 - 44 U/L   Alkaline Phosphatase 70 38 - 126 U/L   Total Bilirubin 0.8 0.3 - 1.2 mg/dL   GFR, Estimated >14 >78 mL/min    Comment: (NOTE) Calculated using the CKD-EPI Creatinine Equation (2021)    Anion gap 16 (H) 5 - 15    Comment: Performed at [facility], [address] [place], [place] [address]  I-Stat beta hCG blood, ED     Status: None   Collection Time: 06/27/2021  1:02 PM  Result Value Ref Range   I-stat hCG, quantitative <5.0 <5 mIU/mL   Comment 3            Comment:   GEST. AGE      CONC.  (mIU/mL)   <=1 WEEK        5 - 50     2 WEEKS       50 - 500     3 WEEKS       100 - 10,000     4 WEEKS     1,000 - 30,000        FEMALE AND NON-PREGNANT FEMALE:     LESS THAN 5 mIU/mL   I-stat chem 8, ED     Status: Abnormal   Collection Time: 06/29/2021  1:03 PM  Result Value Ref Range   Sodium 140 135 -  145 mmol/L   Potassium 3.4 (L) 3.5 - 5.1 mmol/L   Chloride 103 98 - 111 mmol/L   BUN 13 6 - 20 mg/dL   Creatinine, Ser 1.30 0.44 - 1.00 mg/dL   Glucose, Bld 865 (H) 70 - 99 mg/dL    Comment: Glucose reference range applies only to samples taken after fasting for at least 8 hours.   Calcium, Ion 1.20 1.15 - 1.40 mmol/L   TCO2 26 22 - 32 mmol/L   Hemoglobin 13.9 12.0 - 15.0 g/dL   HCT 78.4 69.6 - 29.5 %    DG Chest Portable 1 View  Result Date: 06/22/2021 CLINICAL DATA:  post intubation EXAM: PORTABLE CHEST 1 VIEW COMPARISON:  None. FINDINGS: Endotracheal tube with tip terminating approximately 2 cm of the carina. Hypoventilatory exam. The heart appears enlarged, which may be projectional. No large pleural effusion. No lobar consolidation. No acute osseous abnormality. IMPRESSION: Endotracheal tube terminating within 2 cm of the carina. No focal pulmonary process. Electronically Signed   By: Olive Bass M.D.   On: 07/17/2021 14:17   CT HEAD CODE STROKE WO CONTRAST  Result Date: 06/26/2021 CLINICAL DATA:  Code stroke. Neurological deficit. Acute stroke suspected. EXAM: CT HEAD WITHOUT CONTRAST TECHNIQUE: Contiguous axial images were obtained from the base of the skull through the vertex without intravenous contrast. COMPARISON:  None. FINDINGS: Brain: 6.5 x 3.7 x 5.7 cm (volume = 72 cm^3) acute intraparenchymal hemorrhage with the epicenter in the right basal ganglia. Intraventricular penetration into the right lateral ventricle with extension to the third and fourth ventricles. Mass effect with flattening of the right lateral ventricle and right-to-left midline shift of 9 mm. The remainder the brain is normal. No sign of previous stroke. No extra-axial collection. Vascular: No vascular finding otherwise. Skull: Normal Sinuses/Orbits: Clear/normal Other: None IMPRESSION: 1. Acute infract kummel hemorrhage with the epicenter in the right basal  ganglia, estimated volume 72 cc. Intraventricular  penetration into the right lateral ventricle with extension to the third and fourth ventricles. Mass effect as described. 2. These results were called by telephone at the time of interpretation on 07/10/2021 at 1:21 pm to provider [facility] , who verbally acknowledged these results. Electronically Signed   By: Paulina Fusi M.D.   On: 07/01/2021 13:23   CT ANGIO HEAD CODE STROKE  Result Date: 07/19/2021 CLINICAL DATA:  Stroke follow-up EXAM: CT ANGIOGRAPHY HEAD AND NECK TECHNIQUE: Multidetector CT imaging of the head and neck was performed using the standard protocol during bolus administration of intravenous contrast. Multiplanar CT image reconstructions and MIPs were obtained to evaluate the vascular anatomy. Carotid stenosis measurements (when applicable) are obtained utilizing NASCET criteria, using the distal internal carotid diameter as the denominator. CONTRAST:  54mL OMNIPAQUE IOHEXOL 350 MG/ML SOLN COMPARISON:  Noncontrast CT head obtained earlier the same day FINDINGS: CTA NECK FINDINGS Aortic arch: The aortic arch is unremarkable. Right carotid system: No evidence of dissection, stenosis (50% or greater) or occlusion. Left carotid system: No evidence of dissection, stenosis (50% or greater) or occlusion. Vertebral arteries: Codominant. No evidence of dissection, stenosis (50% or greater) or occlusion. Skeleton: There is mild degenerative change of the cervical spine. There is no acute osseous abnormality or aggressive osseous lesion. Other neck: An endotracheal tube is in place. The tip is not imaged. The lung apices are clear. Upper chest: The soft tissues are unremarkable. Review of the MIP images confirms the above findings CTA HEAD FINDINGS Anterior circulation: The bilateral intracranial ICAs are patent. The bilateral MCAs and ACAs are patent. There is no aneurysm or AVM. Posterior circulation: The V4 segments of the vertebral arteries are patent. The basilar artery is patent. The bilateral  PCAs are patent. There is no aneurysm or AVM. The posterior communicating arteries are not definitely identified. Venous sinuses: As permitted by contrast timing, patent. Anatomic variants: None. There is no evidence of active extravasation. The large intraparenchymal hematoma centered in the right basal ganglia with intraventricular extension into the right lateral ventricle is unchanged. Leftward midline shift is not significantly changed. Review of the MIP images confirms the above findings IMPRESSION: 1. Patent vasculature of the head and neck. No aneurysm or AVM identified. 2. Overall stable large intraparenchymal hematoma centered in the right basal ganglia with unchanged leftward midline shift. No evidence of active extravasation into the hematoma. Electronically Signed   By: Lesia Hausen M.D.   On: 06/24/2021 13:56   CT ANGIO NECK CODE STROKE  Result Date: 07/19/2021 CLINICAL DATA:  Stroke follow-up EXAM: CT ANGIOGRAPHY HEAD AND NECK TECHNIQUE: Multidetector CT imaging of the head and neck was performed using the standard protocol during bolus administration of intravenous contrast. Multiplanar CT image reconstructions and MIPs were obtained to evaluate the vascular anatomy. Carotid stenosis measurements (when applicable) are obtained utilizing NASCET criteria, using the distal internal carotid diameter as the denominator. CONTRAST:  105mL OMNIPAQUE IOHEXOL 350 MG/ML SOLN COMPARISON:  Noncontrast CT head obtained earlier the same day FINDINGS: CTA NECK FINDINGS Aortic arch: The aortic arch is unremarkable. Right carotid system: No evidence of dissection, stenosis (50% or greater) or occlusion. Left carotid system: No evidence of dissection, stenosis (50% or greater) or occlusion. Vertebral arteries: Codominant. No evidence of dissection, stenosis (50% or greater) or occlusion. Skeleton: There is mild degenerative change of the cervical spine. There is no acute osseous abnormality or aggressive osseous  lesion. Other neck: An endotracheal tube is  in place. The tip is not imaged. The lung apices are clear. Upper chest: The soft tissues are unremarkable. Review of the MIP images confirms the above findings CTA HEAD FINDINGS Anterior circulation: The bilateral intracranial ICAs are patent. The bilateral MCAs and ACAs are patent. There is no aneurysm or AVM. Posterior circulation: The V4 segments of the vertebral arteries are patent. The basilar artery is patent. The bilateral PCAs are patent. There is no aneurysm or AVM. The posterior communicating arteries are not definitely identified. Venous sinuses: As permitted by contrast timing, patent. Anatomic variants: None. There is no evidence of active extravasation. The large intraparenchymal hematoma centered in the right basal ganglia with intraventricular extension into the right lateral ventricle is unchanged. Leftward midline shift is not significantly changed. Review of the MIP images confirms the above findings IMPRESSION: 1. Patent vasculature of the head and neck. No aneurysm or AVM identified. 2. Overall stable large intraparenchymal hematoma centered in the right basal ganglia with unchanged leftward midline shift. No evidence of active extravasation into the hematoma. Electronically Signed   By: Lesia Hausen M.D.   On: 07/15/2021 13:56    Review of systems not obtained due to patient factors. Blood pressure 134/74, pulse 86, resp. rate 16, SpO2 100 %. Patient intubated and sedated on ventilator.  Pupils pinpoint.  Gaze conjugate.  Corneal reflexes present bilaterally.  Weak cough and weak gag reflex.  Weak withdrawal on the right.  I cannot elicit any movement on the left.  Chest and abdomen obese but otherwise unremarkable.  Extremities free from injury deformity.  Assessment/Plan: Large right hypertensive basal ganglia hemorrhage with significant mass-effect and shift.  Recommend hypertension control, hypertonic saline, and supportive care.  No role  for surgical intervention at this point.  Sherilyn Cooter A Cordell Coke 06/29/2021, 3:17 PM

## 2021-07-09 NOTE — Progress Notes (Signed)
eLink Physician-Brief Progress Note Patient Name: Katherine Hardin DOB: [birth year] MRN: [id number]   Date of Service  07/10/2021  HPI/Events of Note  Pt on clevidipine and TG level is 499  eICU Interventions  Changed to nicardipine     Intervention Category Minor Interventions: OtherJacinta Shoe 06/25/2021, 9:32 PM

## 2021-07-09 NOTE — Consult Note (Signed)
NAME:  Katherine Hardin, MRN:  [id number], DOB:  06-21-1976, LOS: 0 ADMISSION DATE:  07/08/2021, CONSULTATION DATE:  06/26/2021 REFERRING MD:  Thomasena Edis CHIEF COMPLAINT:  AMS   History of Present Illness:  Katherine Hardin is a 45 y.o. female who has a PMH with know PMH.  She presented to [facility] ED 9/19 with AMS.  She was working at [employer] when she was found slumped over around 1210.  She also had aphasia, facial droop and left sided weakness.  EMS was called and BP extremely elevated at 290/150.  She was transported to ED and vomited en route, no obvious aspiration per ED staff.  In ED, she required intubation for AMS and airway protection.  CTH showed large IPH with intraventricular extension and 13mm MLS.   She was started on Propofol, Fentanyl, Cleviprex.  BP was still elevated so she received 2 doses of Labetalol with improvement.  After intubation, she had intermittent posturing of BUE and BLE though would occasionally reach up for ETT with RUE.  She does not follow commands but is on sedation.  Per EDP notes, husband states she has a hx of headaches for which she takes Excedrin migraine and Tylenol occasionally.  She also takes ASA 81mg  daily.  Pertinent  Medical History:  has ICH (intracerebral hemorrhage) (HCC) on their problem list.  Significant Hospital Events: Including procedures, antibiotic start and stop dates in addition to other pertinent events   9/19 > admit.  Interim History / Subjective:  On vent, sedated.  Objective:  Blood pressure 134/74, pulse 86, resp. rate 16, SpO2 100 %.        Intake/Output Summary (Last 24 hours) at 07/14/2021 1504 Last data filed at 06/22/2021 1420 Gross per 24 hour  Intake 384.92 ml  Output --  Net 384.92 ml   There were no vitals filed for this visit.  Examination: General: Young adult female, critically ill, in NAD. Neuro: Sedated, not responsive.  Intermittent decerebrate posturing of BUE and BLE; though, occasionally does reach for ETT  with RUE. HEENT: [place]/AT. Sclerae anicteric. ETT in place. Cardiovascular: RRR, no M/R/G.  Lungs: Respirations even and unlabored.  CTA bilaterally, No W/R/R.  Abdomen: BS x 4, soft, NT/ND.  Musculoskeletal: No gross deformities, no edema.  Skin: Intact, warm, no rashes.  Labs/imaging personally reviewed:  [facility] 9/19 > acute IPH in right basal ganglia estimated volume 72cc with IV extension, mass effect with 49mm R to L MLS. [facility] 9/20 >  MRI brain 9/20 >   Resolved Hospital Problem list:    Assessment & Plan:   Large IPH with IV extension - presumed hypertensive in nature. - Management per neuro. - NSGY consulted by neuro re EVD indication etc. - Continue Cleviprex with goal SBP < 140. - Empiric Keppra 1g BID for now, will defer to neuro for dosing going forward. - Continue 3% saline per neuro. - F/u on [facility], MRI brain.  Hypertensive emergency. - Cleviprex as above. - Labetalol and Hydralazine PRN above goal. - Assess echo. - Might need home anti-hypertensive regimen upon discharge.  Respiratory insufficiency - due to inability to protect the airway in the setting of above. - Full vent support. - Assess ABG. - Wean as able. - Daily SBT. - Bronchial hygiene. - Follow CXR.  Hypokalemia. - 40 mEq K per tube. - Follow BMP.  Hyperglycemia. - SSI. - Assess Hgb A1c.   Best practice (evaluated daily):  Diet/type: NPO DVT prophylaxis: SCD GI prophylaxis: PPI Lines: N/A Foley:  Yes, and it is still needed Code Status:  full code Last date of multidisciplinary goals of care discussion: Per primary.  Labs   CBC: Recent Labs  Lab 07/05/2021 1254 07/03/2021 1303  WBC 14.7*  --   NEUTROABS 8.7*  --   HGB 12.9 13.9  HCT 42.1 41.0  MCV 82.2  --   PLT 323  --     Basic Metabolic Panel: Recent Labs  Lab 06/22/2021 1254 06/22/2021 1303  NA 139 140  K 3.4* 3.4*  CL 100 103  CO2 23  --   GLUCOSE 213* 212*  BUN 12 13  CREATININE 0.86 0.70  CALCIUM 9.4  --    GFR: CrCl  cannot be calculated (Unknown ideal weight.). Recent Labs  Lab 06/24/2021 1254  WBC 14.7*    Liver Function Tests: Recent Labs  Lab 07/02/2021 1254  AST 25  ALT 22  ALKPHOS 70  BILITOT 0.8  PROT 7.7  ALBUMIN 4.1   No results for input(s): LIPASE, AMYLASE in the last 168 hours. No results for input(s): AMMONIA in the last 168 hours.  ABG    Component Value Date/Time   TCO2 26 06/22/2021 1303     Coagulation Profile: Recent Labs  Lab 07/10/2021 1254  INR 1.0    Cardiac Enzymes: No results for input(s): CKTOTAL, CKMB, CKMBINDEX, TROPONINI in the last 168 hours.  HbA1C: No results found for: HGBA1C  CBG: No results for input(s): GLUCAP in the last 168 hours.  Review of Systems:   Unable to obtain as pt is encephalopathic.  Past Medical History:  She,  has no past medical history on file.   Surgical History:  Not available.  Social History:      Family History:  Her family history is not on file.   Allergies Not on File   Home Medications  Prior to Admission medications   Not on File     Critical care time: 40 min.   Rutherford Guys, PA - [facility] For pager details, please see AMION or use Epic chat  After 1900, please call [facility] for cross coverage needs 06/25/2021, 3:04 PM

## 2021-07-09 NOTE — Progress Notes (Signed)
SLP Cancellation Note  Patient Details Name: Katherine Hardin MRN: [id number] DOB: 01/12/1976   Cancelled treatment:       Reason Eval/Treat Not Completed: Medical issues which prohibited therapy - unable to complete SLE at this time. Pt is currently orally intubated. Will continue efforts.   Kedar Sedano B. Murvin Natal, [facility], CCC-SLP Speech Language Pathologist Office: [phone]  Leigh Aurora 2021-08-01, 1:59 PM

## 2021-07-09 NOTE — Progress Notes (Signed)
Patient transported from ED to room 4N15 without complications.

## 2021-07-09 NOTE — Progress Notes (Signed)
Pt transported to CT and back to room 8. No complications noted.

## 2021-07-09 NOTE — ED Provider Notes (Addendum)
[facility] EMERGENCY DEPARTMENT Provider Note   CSN: [id number] Arrival date & time: 07/01/2021  1252  An emergency department physician performed an initial assessment on this suspected stroke patient at (P) 1251 (to be intubated).  History No chief complaint on file.   Katherine Hardin is a 45 y.o. female.  History obtained from EMS report, chart review.  Level 5 caveat history limited due to acuity, patient's unresponsive status.  Code stroke.  Patient reportedly working at [employer] today and when around 12:10 PM coworker witnessed patient slumping over, noted aphasia, facial droop, left-sided weakness.  Vomiting in route.  Unresponsive on arrival.  HPI     No past medical history on file.  Patient Active Problem List   Diagnosis Date Noted   Encephalopathy acute 06/26/2021   Respiratory failure (HCC)    Hypokalemia    Hyperglycemia      OB History   No obstetric history on file.     No family history on file.     Home Medications Prior to Admission medications   Not on File    Allergies    Patient has no allergy information on record.  Review of Systems   Review of Systems  Unable to perform ROS: Mental status change   Physical Exam Updated Vital Signs BP 137/74   Pulse 81   Resp 18   Wt 110 kg   SpO2 100%   Physical Exam Vitals and nursing note reviewed.  Constitutional:      Appearance: She is well-developed.     Comments: Unresponsive, vomitus noted on shirt  HENT:     Head: Normocephalic and atraumatic.  Eyes:     Conjunctiva/sclera: Conjunctivae normal.  Cardiovascular:     Rate and Rhythm: Normal rate and regular rhythm.     Heart sounds: No murmur heard. Pulmonary:     Comments: Breath sounds normal, present bilaterally, somewhat slow breathing Abdominal:     Palpations: Abdomen is soft.     Tenderness: There is no abdominal tenderness.  Musculoskeletal:     Cervical back: Neck supple.  Skin:    General: Skin  is warm and dry.  Neurological:     GCS: GCS eye subscore is 1. GCS verbal subscore is 1. GCS motor subscore is 3.     Comments: Unresponsive    ED Results / Procedures / Treatments   Labs (all labs ordered are listed, but only abnormal results are displayed) Labs Reviewed  APTT - Abnormal; Notable for the following components:      Result Value   aPTT 23 (*)    All other components within normal limits  CBC - Abnormal; Notable for the following components:   WBC 14.7 (*)    RBC 5.12 (*)    MCH 25.2 (*)    All other components within normal limits  DIFFERENTIAL - Abnormal; Notable for the following components:   Neutro Abs 8.7 (*)    Lymphs Abs 4.5 (*)    Abs Immature Granulocytes 0.40 (*)    All other components within normal limits  COMPREHENSIVE METABOLIC PANEL - Abnormal; Notable for the following components:   Potassium 3.4 (*)    Glucose, Bld 213 (*)    Anion gap 16 (*)    All other components within normal limits  GLUCOSE, CAPILLARY - Abnormal; Notable for the following components:   Glucose-Capillary 208 (*)    All other components within normal limits  I-STAT CHEM 8, ED - Abnormal; Notable  for the following components:   Potassium 3.4 (*)    Glucose, Bld 212 (*)    All other components within normal limits  PROTIME-INR  SODIUM  SODIUM  SODIUM  HIV ANTIBODY (ROUTINE TESTING W REFLEX)  LIPID PANEL  ETHANOL  BLOOD GAS, ARTERIAL  OSMOLALITY  HEMOGLOBIN A1C  URINALYSIS, ROUTINE W REFLEX MICROSCOPIC  RAPID URINE DRUG SCREEN, HOSP PERFORMED  PREGNANCY, URINE  MAGNESIUM  PHOSPHORUS  BLOOD GAS, ARTERIAL  I-STAT BETA HCG BLOOD, ED ([facility], [facility], [facility] ONLY)  CBG MONITORING, ED  TYPE AND SCREEN    EKG EKG Interpretation  Date/Time:  Monday July 09 2021 13:49:27 EDT Ventricular Rate:  117 PR Interval:  166 QRS Duration: 100 QT Interval:  337 QTC Calculation: 471 R Axis:   85 Text Interpretation: Sinus tachycardia Right atrial enlargement Probable LVH with  secondary repol abnrm ST depr, consider ischemia, inferior leads Confirmed by Marianna Fuss ([id number]) on 06/24/2021 3:53:53 PM  Radiology DG Chest Portable 1 View  Result Date: 06/21/2021 CLINICAL DATA:  post intubation EXAM: PORTABLE CHEST 1 VIEW COMPARISON:  None. FINDINGS: Endotracheal tube with tip terminating approximately 2 cm of the carina. Hypoventilatory exam. The heart appears enlarged, which may be projectional. No large pleural effusion. No lobar consolidation. No acute osseous abnormality. IMPRESSION: Endotracheal tube terminating within 2 cm of the carina. No focal pulmonary process. Electronically Signed   By: Olive Bass M.D.   On: 07/01/2021 14:17   CT HEAD CODE STROKE WO CONTRAST  Result Date: 07/06/2021 CLINICAL DATA:  Code stroke. Neurological deficit. Acute stroke suspected. EXAM: CT HEAD WITHOUT CONTRAST TECHNIQUE: Contiguous axial images were obtained from the base of the skull through the vertex without intravenous contrast. COMPARISON:  None. FINDINGS: Brain: 6.5 x 3.7 x 5.7 cm (volume = 72 cm^3) acute intraparenchymal hemorrhage with the epicenter in the right basal ganglia. Intraventricular penetration into the right lateral ventricle with extension to the third and fourth ventricles. Mass effect with flattening of the right lateral ventricle and right-to-left midline shift of 9 mm. The remainder the brain is normal. No sign of previous stroke. No extra-axial collection. Vascular: No vascular finding otherwise. Skull: Normal Sinuses/Orbits: Clear/normal Other: None IMPRESSION: 1. Acute infract kummel hemorrhage with the epicenter in the right basal ganglia, estimated volume 72 cc. Intraventricular penetration into the right lateral ventricle with extension to the third and fourth ventricles. Mass effect as described. 2. These results were called by telephone at the time of interpretation on 07/04/2021 at 1:21 pm to provider [facility] , who verbally acknowledged these  results. Electronically Signed   By: Paulina Fusi M.D.   On: 07/17/2021 13:23   [place] EKG SITE RITE  Result Date: 06/25/2021 If Site Rite image not attached, placement could not be confirmed due to current cardiac rhythm.  CT ANGIO HEAD CODE STROKE  Result Date: 07/08/2021 CLINICAL DATA:  Stroke follow-up EXAM: CT ANGIOGRAPHY HEAD AND NECK TECHNIQUE: Multidetector CT imaging of the head and neck was performed using the standard protocol during bolus administration of intravenous contrast. Multiplanar CT image reconstructions and MIPs were obtained to evaluate the vascular anatomy. Carotid stenosis measurements (when applicable) are obtained utilizing NASCET criteria, using the distal internal carotid diameter as the denominator. CONTRAST:  85mL OMNIPAQUE IOHEXOL 350 MG/ML SOLN COMPARISON:  Noncontrast CT head obtained earlier the same day FINDINGS: CTA NECK FINDINGS Aortic arch: The aortic arch is unremarkable. Right carotid system: No evidence of dissection, stenosis (50% or greater) or occlusion. Left carotid system: No  evidence of dissection, stenosis (50% or greater) or occlusion. Vertebral arteries: Codominant. No evidence of dissection, stenosis (50% or greater) or occlusion. Skeleton: There is mild degenerative change of the cervical spine. There is no acute osseous abnormality or aggressive osseous lesion. Other neck: An endotracheal tube is in place. The tip is not imaged. The lung apices are clear. Upper chest: The soft tissues are unremarkable. Review of the MIP images confirms the above findings CTA HEAD FINDINGS Anterior circulation: The bilateral intracranial ICAs are patent. The bilateral MCAs and ACAs are patent. There is no aneurysm or AVM. Posterior circulation: The V4 segments of the vertebral arteries are patent. The basilar artery is patent. The bilateral PCAs are patent. There is no aneurysm or AVM. The posterior communicating arteries are not definitely identified. Venous sinuses: As  permitted by contrast timing, patent. Anatomic variants: None. There is no evidence of active extravasation. The large intraparenchymal hematoma centered in the right basal ganglia with intraventricular extension into the right lateral ventricle is unchanged. Leftward midline shift is not significantly changed. Review of the MIP images confirms the above findings IMPRESSION: 1. Patent vasculature of the head and neck. No aneurysm or AVM identified. 2. Overall stable large intraparenchymal hematoma centered in the right basal ganglia with unchanged leftward midline shift. No evidence of active extravasation into the hematoma. Electronically Signed   By: Lesia Hausen M.D.   On: 06/28/2021 13:56   CT ANGIO NECK CODE STROKE  Result Date: 07/06/2021 CLINICAL DATA:  Stroke follow-up EXAM: CT ANGIOGRAPHY HEAD AND NECK TECHNIQUE: Multidetector CT imaging of the head and neck was performed using the standard protocol during bolus administration of intravenous contrast. Multiplanar CT image reconstructions and MIPs were obtained to evaluate the vascular anatomy. Carotid stenosis measurements (when applicable) are obtained utilizing NASCET criteria, using the distal internal carotid diameter as the denominator. CONTRAST:  29mL OMNIPAQUE IOHEXOL 350 MG/ML SOLN COMPARISON:  Noncontrast CT head obtained earlier the same day FINDINGS: CTA NECK FINDINGS Aortic arch: The aortic arch is unremarkable. Right carotid system: No evidence of dissection, stenosis (50% or greater) or occlusion. Left carotid system: No evidence of dissection, stenosis (50% or greater) or occlusion. Vertebral arteries: Codominant. No evidence of dissection, stenosis (50% or greater) or occlusion. Skeleton: There is mild degenerative change of the cervical spine. There is no acute osseous abnormality or aggressive osseous lesion. Other neck: An endotracheal tube is in place. The tip is not imaged. The lung apices are clear. Upper chest: The soft tissues  are unremarkable. Review of the MIP images confirms the above findings CTA HEAD FINDINGS Anterior circulation: The bilateral intracranial ICAs are patent. The bilateral MCAs and ACAs are patent. There is no aneurysm or AVM. Posterior circulation: The V4 segments of the vertebral arteries are patent. The basilar artery is patent. The bilateral PCAs are patent. There is no aneurysm or AVM. The posterior communicating arteries are not definitely identified. Venous sinuses: As permitted by contrast timing, patent. Anatomic variants: None. There is no evidence of active extravasation. The large intraparenchymal hematoma centered in the right basal ganglia with intraventricular extension into the right lateral ventricle is unchanged. Leftward midline shift is not significantly changed. Review of the MIP images confirms the above findings IMPRESSION: 1. Patent vasculature of the head and neck. No aneurysm or AVM identified. 2. Overall stable large intraparenchymal hematoma centered in the right basal ganglia with unchanged leftward midline shift. No evidence of active extravasation into the hematoma. Electronically Signed   By: Theron Arista  Noone M.D.   On: 07/10/2021 13:56    Procedures .Critical Care Performed by: Milagros Loll, MD Authorized by: Milagros Loll, MD   Critical care provider statement:    Critical care time (minutes):  45   Critical care was time spent personally by me on the following activities:  Discussions with consultants, evaluation of patient's response to treatment, examination of patient, ordering and performing treatments and interventions, ordering and review of laboratory studies, ordering and review of radiographic studies, pulse oximetry, re-evaluation of patient's condition, obtaining history from patient or surrogate and review of old charts Procedure Name: Intubation Date/Time: 07/18/2021 3:51 PM Performed by: Milagros Loll, MD Pre-anesthesia Checklist: Patient  identified, Patient being monitored, Emergency Drugs available, Timeout performed and Suction available Oxygen Delivery Method: Non-rebreather mask Preoxygenation: Pre-oxygenation with 100% oxygen Induction Type: Rapid sequence Ventilation: Mask ventilation without difficulty Laryngoscope Size: Glidescope and 4 Grade View: Grade I Tube size: 8.0 mm Number of attempts: 1 Airway Equipment and Method: Rigid stylet, Stylet and Video-laryngoscopy Placement Confirmation: ETT inserted through vocal cords under direct vision, CO2 detector and Breath sounds checked- equal and bilateral Secured at: 23 cm Tube secured with: ETT holder Comments: One attempt, good visualization of airway with video, atraumatic      Medications Ordered in ED Medications  sodium chloride flush (NS) 0.9 % injection 3 mL (has no administration in time range)  etomidate (AMIDATE) injection (30 mg Intravenous Given 07/17/2021 1259)  succinylcholine (ANECTINE) injection (160 mg Intravenous Given 06/26/2021 1259)  sodium chloride (hypertonic) 3 % solution ( Intravenous New Bag/Given 07/08/2021 1418)   stroke: mapping our early stages of recovery book (has no administration in time range)  acetaminophen (TYLENOL) tablet 650 mg (has no administration in time range)    Or  acetaminophen (TYLENOL) 160 MG/5ML solution 650 mg (has no administration in time range)    Or  acetaminophen (TYLENOL) suppository 650 mg (has no administration in time range)  senna-docusate (Senokot-S) tablet 1 tablet (has no administration in time range)  pantoprazole (PROTONIX) injection 40 mg (has no administration in time range)  fentaNYL (SUBLIMAZE) injection 50 mcg (50 mcg Intravenous Not Given 07/14/2021 1542)  fentaNYL in NS (29mcg/ml) infusion-PREMIX (50 mcg/hr Intravenous Infusion Verify 07/20/2021 1357)  fentaNYL (SUBLIMAZE) bolus via infusion 50-100 mcg (has no administration in time range)  clevidipine (CLEVIPREX) infusion (32 mg/hr  Intravenous New Bag/Given 07/14/2021 1514)  labetalol (NORMODYNE) injection 10 mg (10 mg Intravenous Given 06/30/2021 1406)  mannitol 20 % IVPB 80 g (has no administration in time range)  docusate (COLACE) 50 MG/5ML liquid 100 mg (100 mg Per Tube Not Given 07/06/2021 1542)  polyethylene glycol (MIRALAX / GLYCOLAX) packet 17 g (17 g Per Tube Not Given 07/05/2021 1544)  propofol (DIPRIVAN) 1000 MG/100ML infusion (has no administration in time range)  hydrALAZINE (APRESOLINE) injection 10-40 mg (has no administration in time range)  levETIRAcetam (KEPPRA) IVPB 1000 mg/100 mL premix (has no administration in time range)  potassium chloride (KLOR-CON) packet 40 mEq (has no administration in time range)  insulin aspart (novoLOG) injection 2-6 Units (has no administration in time range)  sodium chloride 3% (hypertonic) IV bolus 250 mL (0 mLs Intravenous Stopped 07/03/2021 1420)  propofol (DIPRIVAN) bolus via infusion 40 mg (40 mg Intravenous Bolus from Bag 07/07/2021 1322)  propofol (DIPRIVAN) bolus via infusion 50 mg (50 mg Intravenous Bolus from Bag 07/18/2021 1330)  iohexol (OMNIPAQUE) 350 MG/ML injection 75 mL (75 mLs Intravenous Contrast Given 07/02/2021 1342)  fentaNYL (SUBLIMAZE)  injection 50 mcg (0 mcg Intravenous Duplicate 07/18/2021 1543)    ED Course  I have reviewed the triage vital signs and the nursing notes.  Pertinent labs & imaging results that were available during my care of the patient were reviewed by me and considered in my medical decision making (see chart for details).    MDM Rules/Calculators/A&P                           45 year old lady presented to ER as code stroke.  Sudden onset of reported aphasia and left-sided weakness.  On arrival to ER patient is completely unresponsive, did have some flexion to pain on her right side.  No eye or verbal response.  Performed RSI to secure her airway.  Taken emergently to CT scanner.  Found to have large intraparenchymal hemorrhage.  Neurology contacted  neurosurgery to evaluate patient. CCM consulted by neuro as well for vent management. Will be admitted to the neurology service as primary.  Cleviprex for BP control.  Final Clinical Impression(s) / ED Diagnoses Final diagnoses:  Hemorrhagic stroke (HCC)  Altered mental status, unspecified altered mental status type  Hypertensive crisis    Rx / DC Orders ED Discharge Orders     None        Milagros Loll, MD 07/06/2021 1552    Milagros Loll, MD 07/14/2021 1553    Milagros Loll, MD 06/29/2021 1554

## 2021-07-09 NOTE — Code Documentation (Addendum)
Stroke Response Nurse Documentation Code Documentation  Katherine Hardin is a 45 y.o. female arriving to [facility] ED via [facility] on 07/11/2021 with unknown past medical hx. On no known antithrombotic. Code stroke was activated by EMS.   Patient from [employer] at work where she was witnessed slumping over at 1210 by a coworker. Patient had left facial droop, left neglect, left weakness, and aphasia. BP 290/150 and vomited en route. Patient unresponsive upon arrival.   Stroke team at the bedside on patient arrival. Labs drawn and Dr. Stevie Kern took patient to ED8 for intubation for airway protection. Patient to CT with team. NIHSS 31, see documentation for details and code stroke times. Patient with decreased LOC, disoriented, not following commands, disconjugate gaze preference , bilateral hemianopia, left facial droop, bilateral arm weakness, bilateral leg weakness, left decreased sensation, Global aphasia , dysarthria , and Sensory  neglect on exam. The following imaging was completed: CT, CTA head and neck. Patient is not a candidate for IV Thrombolytic or IR thrombectomy due to ICH.    Cleviprex gtt started in CT, see MAR.   1400 MD Collins notified of Cleviprex maxed at 32mg /hr, order for PRN labetalol obtained.  1420 Orders placed for Mannitol, loss of IV access, CCM Desai PA and Neuro NP aware of delay in Mannitol administration.  Care/Plan: mNIHSS q15 min x2h, q48min x6h, q1h x16 h; SBP <140.   Bedside handoff with ED RN Paden and ICU RN [place].    [facility]  Stroke Response RN

## 2021-07-09 NOTE — Progress Notes (Signed)
Patient belongings include clothing, shoes, two silver hoop earrings, and apple watch.

## 2021-07-09 NOTE — Progress Notes (Signed)
VAST consulted initially for PIV placement, discussed with primary team that central access recommended based on vesicant infusions. VAST consult for 5th PIV discontinued at this time and PICC placement ordered.

## 2021-07-09 NOTE — Progress Notes (Signed)
Per MD Agarwala, okay to use OG tube from XR.

## 2021-07-10 ENCOUNTER — Inpatient Hospital Stay (HOSPITAL_COMMUNITY): Payer: Medicaid Other

## 2021-07-10 DIAGNOSIS — I6389 Other cerebral infarction: Secondary | ICD-10-CM

## 2021-07-10 DIAGNOSIS — E876 Hypokalemia: Secondary | ICD-10-CM

## 2021-07-10 DIAGNOSIS — J9601 Acute respiratory failure with hypoxia: Secondary | ICD-10-CM

## 2021-07-10 DIAGNOSIS — I610 Nontraumatic intracerebral hemorrhage in hemisphere, subcortical: Secondary | ICD-10-CM

## 2021-07-10 DIAGNOSIS — G9340 Encephalopathy, unspecified: Secondary | ICD-10-CM

## 2021-07-10 LAB — ECHOCARDIOGRAM COMPLETE
AR max vel: 3.26 cm2
AV Area VTI: 3.43 cm2
AV Area mean vel: 3.5 cm2
AV Mean grad: 8 mmHg
AV Peak grad: 16 mmHg
Ao pk vel: 2 m/s
Area-P 1/2: 3.89 cm2
S' Lateral: 2.6 cm
Weight: 3880.1 oz

## 2021-07-10 LAB — CBC
HCT: 32.5 % — ABNORMAL LOW (ref 36.0–46.0)
Hemoglobin: 9.6 g/dL — ABNORMAL LOW (ref 12.0–15.0)
MCH: 25.2 pg — ABNORMAL LOW (ref 26.0–34.0)
MCHC: 29.5 g/dL — ABNORMAL LOW (ref 30.0–36.0)
MCV: 85.3 fL (ref 80.0–100.0)
Platelets: 239 10*3/uL (ref 150–400)
RBC: 3.81 MIL/uL — ABNORMAL LOW (ref 3.87–5.11)
RDW: 16 % — ABNORMAL HIGH (ref 11.5–15.5)
WBC: 12.7 10*3/uL — ABNORMAL HIGH (ref 4.0–10.5)
nRBC: 0 % (ref 0.0–0.2)

## 2021-07-10 LAB — GLUCOSE, CAPILLARY
Glucose-Capillary: 149 mg/dL — ABNORMAL HIGH (ref 70–99)
Glucose-Capillary: 159 mg/dL — ABNORMAL HIGH (ref 70–99)
Glucose-Capillary: 161 mg/dL — ABNORMAL HIGH (ref 70–99)
Glucose-Capillary: 161 mg/dL — ABNORMAL HIGH (ref 70–99)
Glucose-Capillary: 163 mg/dL — ABNORMAL HIGH (ref 70–99)
Glucose-Capillary: 166 mg/dL — ABNORMAL HIGH (ref 70–99)
Glucose-Capillary: 181 mg/dL — ABNORMAL HIGH (ref 70–99)
Glucose-Capillary: 188 mg/dL — ABNORMAL HIGH (ref 70–99)
Glucose-Capillary: 193 mg/dL — ABNORMAL HIGH (ref 70–99)
Glucose-Capillary: 196 mg/dL — ABNORMAL HIGH (ref 70–99)
Glucose-Capillary: 215 mg/dL — ABNORMAL HIGH (ref 70–99)
Glucose-Capillary: 217 mg/dL — ABNORMAL HIGH (ref 70–99)
Glucose-Capillary: 218 mg/dL — ABNORMAL HIGH (ref 70–99)
Glucose-Capillary: 243 mg/dL — ABNORMAL HIGH (ref 70–99)
Glucose-Capillary: 243 mg/dL — ABNORMAL HIGH (ref 70–99)

## 2021-07-10 LAB — BASIC METABOLIC PANEL
Anion gap: 8 (ref 5–15)
BUN: 10 mg/dL (ref 6–20)
BUN: 9 mg/dL (ref 6–20)
CO2: 17 mmol/L — ABNORMAL LOW (ref 22–32)
CO2: 24 mmol/L (ref 22–32)
Calcium: 6.6 mg/dL — ABNORMAL LOW (ref 8.9–10.3)
Calcium: 8.4 mg/dL — ABNORMAL LOW (ref 8.9–10.3)
Chloride: >130 mmol/L (ref 98–111)
Chloride: 115 mmol/L — ABNORMAL HIGH (ref 98–111)
Creatinine, Ser: 0.75 mg/dL (ref 0.44–1.00)
Creatinine, Ser: 0.86 mg/dL (ref 0.44–1.00)
GFR, Estimated: >60 mL/min (ref >60)
GFR, Estimated: >60 mL/min (ref >60)
Glucose, Bld: 169 mg/dL — ABNORMAL HIGH (ref 70–99)
Glucose, Bld: 188 mg/dL — ABNORMAL HIGH (ref 70–99)
Potassium: 4.9 mmol/L (ref 3.5–5.1)
Potassium: 3.8 mmol/L (ref 3.5–5.1)
Sodium: 173 mmol/L (ref 135–145)
Sodium: 147 mmol/L — ABNORMAL HIGH (ref 135–145)

## 2021-07-10 LAB — SODIUM
Sodium: 145 mmol/L (ref 135–145)
Sodium: >180 mmol/L (ref 135–145)
Sodium: 155 mmol/L — ABNORMAL HIGH (ref 135–145)
Sodium: 157 mmol/L — ABNORMAL HIGH (ref 135–145)

## 2021-07-10 LAB — TRIGLYCERIDES
Triglycerides: 2540 mg/dL — ABNORMAL HIGH (ref <150)
Triglycerides: 117 mg/dL (ref <150)

## 2021-07-10 MED ORDER — INSULIN ASPART 100 UNIT/ML IJ SOLN
0.0000 [IU] | INTRAMUSCULAR | Status: DC
  Administered 2021-07-10 – 2021-07-11 (×4): 5 [IU] via SUBCUTANEOUS
  Administered 2021-07-11: 8 [IU] via SUBCUTANEOUS

## 2021-07-10 MED ORDER — VITAL AF 1.2 CAL PO LIQD
1000.0000 mL | ORAL | Status: DC
  Administered 2021-07-10 – 2021-07-11 (×2): 1000 mL

## 2021-07-10 MED ORDER — INSULIN ASPART 100 UNIT/ML IJ SOLN: 2.0000 [IU] | INTRAMUSCULAR | Status: DC | PRN

## 2021-07-10 MED ORDER — HYDRALAZINE HCL 10 MG PO TABS
10.0000 mg | ORAL_TABLET | Freq: Once | ORAL | Status: AC
  Administered 2021-07-10: 10 mg
  Filled 2021-07-10: qty 1

## 2021-07-10 MED ORDER — AMLODIPINE BESYLATE 10 MG PO TABS
10.0000 mg | ORAL_TABLET | Freq: Every day | ORAL | Status: DC
  Administered 2021-07-10 – 2021-07-12 (×3): 10 mg
  Filled 2021-07-10 (×3): qty 1

## 2021-07-10 MED ORDER — LISINOPRIL 20 MG PO TABS
40.0000 mg | ORAL_TABLET | Freq: Every day | ORAL | Status: DC
  Administered 2021-07-10 – 2021-07-13 (×4): 40 mg
  Filled 2021-07-10 (×4): qty 2

## 2021-07-10 MED ORDER — INSULIN DETEMIR 100 UNIT/ML ~~LOC~~ SOLN
22.0000 [IU] | Freq: Two times a day (BID) | SUBCUTANEOUS | Status: DC
  Filled 2021-07-10 (×2): qty 0.22

## 2021-07-10 MED ORDER — LEVETIRACETAM 500 MG PO TABS
1000.0000 mg | ORAL_TABLET | Freq: Two times a day (BID) | ORAL | Status: DC
  Filled 2021-07-10: qty 2

## 2021-07-10 MED ORDER — LIVING WELL WITH DIABETES BOOK
Freq: Once | Status: AC
  Filled 2021-07-10: qty 1

## 2021-07-10 MED ORDER — INSULIN ASPART 100 UNIT/ML IJ SOLN: 2.0000 [IU] | INTRAMUSCULAR | Status: DC

## 2021-07-10 MED ORDER — PROSOURCE TF PO LIQD: 90.0000 mL | Freq: Three times a day (TID) | ORAL | Status: DC

## 2021-07-10 MED ORDER — LEVETIRACETAM 100 MG/ML PO SOLN
1000.0000 mg | Freq: Two times a day (BID) | ORAL | Status: DC
  Administered 2021-07-10 – 2021-07-13 (×6): 1000 mg
  Filled 2021-07-10 (×6): qty 10

## 2021-07-10 MED ORDER — INSULIN DETEMIR 100 UNIT/ML ~~LOC~~ SOLN
22.0000 [IU] | Freq: Two times a day (BID) | SUBCUTANEOUS | Status: DC
  Administered 2021-07-10 (×2): 22 [IU] via SUBCUTANEOUS
  Filled 2021-07-10 (×4): qty 0.22

## 2021-07-10 MED ORDER — PANTOPRAZOLE 2 MG/ML SUSPENSION
40.0000 mg | Freq: Every day | ORAL | Status: DC
  Administered 2021-07-10 – 2021-07-13 (×4): 40 mg
  Filled 2021-07-10 (×4): qty 20

## 2021-07-10 NOTE — Progress Notes (Signed)
Pharmacy called around 2120 and started pts triglyceride level was 499.  Pt was on Cleviprex, Fentanyl, and 3%.  Pharmacy contacted CCM to inform them of the labs.  CCM discontinued Cleviprex and started pt on Nicardipine drip.  I contacted Dr. Iver Nestle to make sure she was okay with these changes, and I let her know pts sodium dropped from 139-133.  Dr. Iver Nestle said to keep pt on Cleviprex, increase 3% to run at 100 mL/hr, and collect a stat sodium lab.  I informed Dr. Iver Nestle of the results.  Orders are to keep 3% running at 100 ml/hr.

## 2021-07-10 NOTE — Progress Notes (Addendum)
STROKE TEAM PROGRESS NOTE   INTERVAL HISTORY Her sister is at the bedside.  Patient remains intubated and on cleviprex. Patient able to follow some commands and able to move right side spontaneously.  Vitals:   07/10/21 0645 07/10/21 0700 07/10/21 0721 07/10/21 0727  BP: 118/63 126/64 123/64   Pulse: 90 91 89   Resp: (!) 23 18 18    Temp:    99.5 F (37.5 C)  TempSrc:    Axillary  SpO2: 93% 96% 97%   Weight:       CBC:  Recent Labs  Lab 08/06/2021 1254 August 06, 2021 1303 06-Aug-2021 1609 07/10/21 0603  WBC 14.7*  --   --  12.7*  NEUTROABS 8.7*  --   --   --   HGB 12.9   < > 12.2 9.6*  HCT 42.1   < > 36.0 32.5*  MCV 82.2  --   --  85.3  PLT 323  --   --  239   < > = values in this interval not displayed.   Basic Metabolic Panel:  Recent Labs  Lab 2021/08/06 1611 06-Aug-2021 1851 07/10/21 0339 07/10/21 0534  NA 139   < > 173* 147*  K  --   --  4.9 3.8  CL  --   --  >130* 115*  CO2  --   --  17* 24  GLUCOSE  --   --  169* 188*  BUN  --   --  10 9  CREATININE  --   --  0.75 0.86  CALCIUM  --   --  6.6* 8.4*  MG 2.1  --   --   --   PHOS 3.3  --   --   --    < > = values in this interval not displayed.   Lipid Panel:  Recent Labs  Lab 08/06/2021 1611 07/10/21 0339 07/10/21 0534  CHOL 208*  --   --   TRIG 499*   < > 117  HDL 39*  --   --   CHOLHDL 5.3  --   --   VLDL UNABLE TO CALCULATE IF TRIGLYCERIDE OVER 400 mg/dL  --   --   LDLCALC UNABLE TO CALCULATE IF TRIGLYCERIDE OVER 400 mg/dL  --   --    < > = values in this interval not displayed.   HgbA1c:  Recent Labs  Lab 08/06/21 1611  HGBA1C 8.2*   Urine Drug Screen:  Recent Labs  Lab 08/06/2021 1631  LABOPIA NONE DETECTED  COCAINSCRNUR NONE DETECTED  LABBENZ NONE DETECTED  AMPHETMU NONE DETECTED  THCU NONE DETECTED  LABBARB NONE DETECTED    Alcohol Level  Recent Labs  Lab August 06, 2021 1603  ETH <10    IMAGING past 24 hours CT HEAD WO CONTRAST  Result Date: 2021-08-06 CLINICAL DATA:  Follow-up intracranial  hemorrhage. EXAM: CT HEAD WITHOUT CONTRAST TECHNIQUE: Contiguous axial images were obtained from the base of the skull through the vertex without intravenous contrast. COMPARISON:  Earlier CT dated 2021/08/06. FINDINGS: Brain: Large intraparenchymal hemorrhage centered at right basal ganglia measuring 7.2 x 3.3 cm (previously 7.0 x 3.8 cm by my measurements). There is extension of the bleed into the ventricular system with involvement of the right lateral ventricle. Small amount of blood is also noted layering in the occipital horn of the left lateral ventricle. There is blood within the fourth ventricle. There is mass effect and compression of the right lateral ventricle. There is proximally 9 mm right  to left midline shift. No transtentorial herniation. Vascular: No hyperdense vessel or unexpected calcification. Skull: Normal. Negative for fracture or focal lesion. Sinuses/Orbits: No acute finding. Other: None IMPRESSION: Overall similar or slight interval increase in size of the right basal ganglia intraparenchymal hemorrhage with extension into the ventricular system. There is mass effect and compression of the right lateral ventricle with proximally 9 mm right to left midline shift. Continued close follow-up recommended. Electronically Signed   By: Elgie Collard M.D.   On: 06/21/2021 20:16   Portable Chest xray  Result Date: 07/10/2021 CLINICAL DATA:  Endotracheal tube. EXAM: PORTABLE CHEST 1 VIEW COMPARISON:  July 09, 2021. FINDINGS: Stable cardiomegaly. Endotracheal and nasogastric tubes are in good position. Right-sided PICC line is noted with tip in expected position of right atrium. Hypoinflation of the lungs is noted with mild bibasilar subsegmental atelectasis. Bony thorax is unremarkable. IMPRESSION: Right-sided PICC line is noted with tip in expected position of right atrium. Hypoinflation of the lungs is noted with mild bibasilar subsegmental atelectasis. Electronically Signed   By: Lupita Raider M.D.   On: 07/10/2021 08:27   DG Chest Portable 1 View  Result Date: 07/10/2021 CLINICAL DATA:  post intubation EXAM: PORTABLE CHEST 1 VIEW COMPARISON:  None. FINDINGS: Endotracheal tube with tip terminating approximately 2 cm of the carina. Hypoventilatory exam. The heart appears enlarged, which may be projectional. No large pleural effusion. No lobar consolidation. No acute osseous abnormality. IMPRESSION: Endotracheal tube terminating within 2 cm of the carina. No focal pulmonary process. Electronically Signed   By: Olive Bass M.D.   On: 06/30/2021 14:17   CT HEAD CODE STROKE WO CONTRAST  Result Date: 07/10/2021 CLINICAL DATA:  Code stroke. Neurological deficit. Acute stroke suspected. EXAM: CT HEAD WITHOUT CONTRAST TECHNIQUE: Contiguous axial images were obtained from the base of the skull through the vertex without intravenous contrast. COMPARISON:  None. FINDINGS: Brain: 6.5 x 3.7 x 5.7 cm (volume = 72 cm^3) acute intraparenchymal hemorrhage with the epicenter in the right basal ganglia. Intraventricular penetration into the right lateral ventricle with extension to the third and fourth ventricles. Mass effect with flattening of the right lateral ventricle and right-to-left midline shift of 9 mm. The remainder the brain is normal. No sign of previous stroke. No extra-axial collection. Vascular: No vascular finding otherwise. Skull: Normal Sinuses/Orbits: Clear/normal Other: None IMPRESSION: 1. Acute infract kummel hemorrhage with the epicenter in the right basal ganglia, estimated volume 72 cc. Intraventricular penetration into the right lateral ventricle with extension to the third and fourth ventricles. Mass effect as described. 2. These results were called by telephone at the time of interpretation on 06/26/2021 at 1:21 pm to provider [facility] , who verbally acknowledged these results. Electronically Signed   By: Paulina Fusi M.D.   On: 07/19/2021 13:23   [place] EKG SITE  RITE  Result Date: 07/01/2021 If Site Rite image not attached, placement could not be confirmed due to current cardiac rhythm.  CT ANGIO HEAD CODE STROKE  Result Date: 06/29/2021 CLINICAL DATA:  Stroke follow-up EXAM: CT ANGIOGRAPHY HEAD AND NECK TECHNIQUE: Multidetector CT imaging of the head and neck was performed using the standard protocol during bolus administration of intravenous contrast. Multiplanar CT image reconstructions and MIPs were obtained to evaluate the vascular anatomy. Carotid stenosis measurements (when applicable) are obtained utilizing NASCET criteria, using the distal internal carotid diameter as the denominator. CONTRAST:  42mL OMNIPAQUE IOHEXOL 350 MG/ML SOLN COMPARISON:  Noncontrast CT head obtained earlier the same  day FINDINGS: CTA NECK FINDINGS Aortic arch: The aortic arch is unremarkable. Right carotid system: No evidence of dissection, stenosis (50% or greater) or occlusion. Left carotid system: No evidence of dissection, stenosis (50% or greater) or occlusion. Vertebral arteries: Codominant. No evidence of dissection, stenosis (50% or greater) or occlusion. Skeleton: There is mild degenerative change of the cervical spine. There is no acute osseous abnormality or aggressive osseous lesion. Other neck: An endotracheal tube is in place. The tip is not imaged. The lung apices are clear. Upper chest: The soft tissues are unremarkable. Review of the MIP images confirms the above findings CTA HEAD FINDINGS Anterior circulation: The bilateral intracranial ICAs are patent. The bilateral MCAs and ACAs are patent. There is no aneurysm or AVM. Posterior circulation: The V4 segments of the vertebral arteries are patent. The basilar artery is patent. The bilateral PCAs are patent. There is no aneurysm or AVM. The posterior communicating arteries are not definitely identified. Venous sinuses: As permitted by contrast timing, patent. Anatomic variants: None. There is no evidence of active  extravasation. The large intraparenchymal hematoma centered in the right basal ganglia with intraventricular extension into the right lateral ventricle is unchanged. Leftward midline shift is not significantly changed. Review of the MIP images confirms the above findings IMPRESSION: 1. Patent vasculature of the head and neck. No aneurysm or AVM identified. 2. Overall stable large intraparenchymal hematoma centered in the right basal ganglia with unchanged leftward midline shift. No evidence of active extravasation into the hematoma. Electronically Signed   By: Lesia Hausen M.D.   On: 07/06/2021 13:56   CT ANGIO NECK CODE STROKE  Result Date: 07/18/2021 CLINICAL DATA:  Stroke follow-up EXAM: CT ANGIOGRAPHY HEAD AND NECK TECHNIQUE: Multidetector CT imaging of the head and neck was performed using the standard protocol during bolus administration of intravenous contrast. Multiplanar CT image reconstructions and MIPs were obtained to evaluate the vascular anatomy. Carotid stenosis measurements (when applicable) are obtained utilizing NASCET criteria, using the distal internal carotid diameter as the denominator. CONTRAST:  73mL OMNIPAQUE IOHEXOL 350 MG/ML SOLN COMPARISON:  Noncontrast CT head obtained earlier the same day FINDINGS: CTA NECK FINDINGS Aortic arch: The aortic arch is unremarkable. Right carotid system: No evidence of dissection, stenosis (50% or greater) or occlusion. Left carotid system: No evidence of dissection, stenosis (50% or greater) or occlusion. Vertebral arteries: Codominant. No evidence of dissection, stenosis (50% or greater) or occlusion. Skeleton: There is mild degenerative change of the cervical spine. There is no acute osseous abnormality or aggressive osseous lesion. Other neck: An endotracheal tube is in place. The tip is not imaged. The lung apices are clear. Upper chest: The soft tissues are unremarkable. Review of the MIP images confirms the above findings CTA HEAD FINDINGS  Anterior circulation: The bilateral intracranial ICAs are patent. The bilateral MCAs and ACAs are patent. There is no aneurysm or AVM. Posterior circulation: The V4 segments of the vertebral arteries are patent. The basilar artery is patent. The bilateral PCAs are patent. There is no aneurysm or AVM. The posterior communicating arteries are not definitely identified. Venous sinuses: As permitted by contrast timing, patent. Anatomic variants: None. There is no evidence of active extravasation. The large intraparenchymal hematoma centered in the right basal ganglia with intraventricular extension into the right lateral ventricle is unchanged. Leftward midline shift is not significantly changed. Review of the MIP images confirms the above findings IMPRESSION: 1. Patent vasculature of the head and neck. No aneurysm or AVM identified. 2. Overall stable  large intraparenchymal hematoma centered in the right basal ganglia with unchanged leftward midline shift. No evidence of active extravasation into the hematoma. Electronically Signed   By: Lesia Hausen M.D.   On: 07-31-21 13:56    Physical Exam  Constitutional: Appears well-developed and well-nourished.  Eyes: Normal external eye and conjunctiva. HENT: Normocephalic, no lesions, without obvious abnormality.   Musculoskeletal-no joint tenderness, deformity or swelling Cardiovascular: Normal rate and regular rhythm.  Respiratory: Effort normal, non-labored breathing saturations WNL GI: Soft.  No distension. There is no tenderness.  Skin: WDI   Neuro:  Mental Status: Patient is intubated. Able to follow some simple commands such as show me your thumb/ give me two fingers/ and raise your arm. She is only able to follow on the right side. Cranial Nerves: Pupils equally round and reactive to light. Cough and gag intact. Face appears symmetric in presence of ETT.  Motor/Sesnory: Able to raise RUE anti gravity. Able to bend RLE, but did not raise anti-gravity  on assessment. LUE/LLE  unable to raise anti-gravity. Does respond to noxious stimuli in LUE/LLE with grimace or right side movements. Unable to localize or withdraw to left side noxious stimuli. Deep Tendon Reflexes: 2+ and symmetric biceps, patella Cerebellar: UTA Gait: deferred     ASSESSMENT/PLAN Ms. Katherine Hardin is a 45 y.o. female female with an unknown PMHx as only note in Epic is a delivery 10 years ago. Patient was brought in by EMS as a code stroke. Patient was working at [employer] today, when at 1210, a co worker witnessed her slumping over. Patient had aphasia, facial droop and left neglect with weakness. BP 290/150. CBG 211. She vomited in route as well and was unresponsive when arrived. After brief exam at the ED bridge, patient was taken to ED 8 and intubated emergently. Propofol was started after intubation. Cleviprex was started after bleed confirmed.   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  Code Stroke CT head. Acute infract kummel hemorrhage with the epicenter in the right basal ganglia, estimated volume 72 cc. Intraventricular penetration into the right lateral ventricle with extension to the third and fourth ventricles. Mass effect as described.   CTA head & neck  1. Patent vasculature of the head and neck. No aneurysm or AVM identified. 2. Overall stable large intraparenchymal hematoma centered in the right basal ganglia with unchanged leftward midline shift. No evidence of active extravasation into the hematoma. repeat CT Overall similar or slight interval increase in size of the right basal ganglia intraparenchymal hemorrhage with extension into the ventricular system. There is mass effect and compression of the right lateral ventricle with proximally 9 mm right to left midline shift. Continued close follow-up recommended. MRI  pending 2D Echo EF 65-70% LDL UNABLE TO CALCULATE IF TRIGLYCERIDE OVER 400 mg/dL [id number] 8.2 VTE prophylaxis - SCD's    Diet    Diet NPO time specified   aspirin 81 mg daily prior to admission, now on No antithrombotic. D/t ICH Therapy recommendations:  pending Disposition:  pending  Hypertensive emergency Home meds:  none Unstable on admission, stable now on Cleviprex drip  Goal SBP < 140 Long-term BP goal normotensive  Hyperlipidemia Home meds:  none,  LDL UNABLE TO CALCULATE IF TRIGLYCERIDE OVER 400 mg/dL, goal < 70 Add atorvastatin 80 mg daily to start before discharge Continue statin at discharge  Diabetes type II Uncontrolled Home meds:  none HgbA1c 8.2, goal < 7.0 CBGs Recent Labs    07/10/21 0548 07/10/21  3762 07/10/21 0756  GLUCAP 188* 181* 163*     Other Stroke Risk Factors Cigarette smoker  history unknown @ this time ETOH use, alcohol level <10, advised to drink no more than 1 drink a day  Consults: Neurosurgery: per Dr.Pool patient is not a candidate for surgery  Hospital day # 1  Valentina Lucks, MSN, NP-C [facility] [phone]  ATTENDING NOTE: I reviewed above note and agree with the assessment and plan. Pt was seen and examined.   45 year old female with history of headache admitted for left facial droop, left neglect, aphasia, slumped over, nausea vomiting, hypertensive, unresponsive and was intubated on presentation.  CT showed large right BG ICH with IVH, midline shift around 9 mm.  CTA head and neck no AVM or aneurysm.  CT repeat stable large right BG ICH and midline shift.  MRI again confirmed large right ICH, no underlying malignancy, midline shift around 8 mm.  EF 65 to 70%, severe LV hypertrophy.  A1c 8.2, LDL pending, TG 499.  Creatinine 0.86, UDS negative, WBC 14.7, Na 139-141-147-155.  On exam, patient intubated on sedation, drowsy sleepy, however open eyes on voice, follow simple central and peripheral commands.  Pupil equal reactive, right gaze preference, however left gaze incomplete.  Not blinking to visual threat bilaterally, doll's eyes  present. Corneal reflex present, gag and cough present. Breathing over the vent.  Facial symmetry not able to test due to ET tube.  Tongue protrusion not cooperative. On pain stimulation, left hemiplegia, right upper and lower extremity 3/5, positive babinski on the right. Sensation, coordination not corporative and gait not tested.  Etiology for patient large ICH likely due to hypertensive emergency.  Currently on Cleviprex maximize dose, will add p.o. BP meds to taper off Cleviprex as able.  Continue Keppra.  Neurosurgery Dr. Dutch Quint on board, no surgical intervention recommended at this time.  Patient has significant midline shift, high risk of cerebral edema and brain herniation, continue 3% saline and sodium goal 150-155.  Patient has PICC line, will do 23.4% saline if needed.  Continue ventilation management per CCM.  Hyperglycemia on insulin.  I had long discussion with sister at bedside, updated pt current condition, treatment plan and potential prognosis, and answered all the questions.  She expressed understanding and appreciation.   For detailed assessment and plan, please refer to above as I have made changes wherever appropriate.   Marvel Plan, MD PhD Stroke Neurology 07/10/2021 8:10 PM  This patient is critically ill due to large right BG ICH, IVH, cerebral edema, hypertensive emergency, hyperglycemia, respiratory failure and at significant risk of neurological worsening, death form brain herniation, hypertensive encephalopathy, DKA, seizure, brain death. This patient's care requires constant monitoring of vital signs, hemodynamics, respiratory and cardiac monitoring, review of multiple databases, neurological assessment, discussion with family, other specialists and medical decision making of high complexity. I spent 45 minutes of neurocritical care time in the care of this patient. I had long discussion with sister at bedside, updated pt current condition, treatment plan and potential  prognosis, and answered all the questions.  She expressed understanding and appreciation.    To contact Stroke Continuity provider, please refer to [email or web address]. After hours, contact General Neurology

## 2021-07-10 NOTE — Progress Notes (Signed)
Initial Nutrition Assessment  DOCUMENTATION CODES:   Not applicable  INTERVENTION:   If remains intubated recommend  Initiate tube feeding: Vital AF 1.2 at 45 ml/h (1080 ml per day) Prosource TF 90 ml TID  Provides 1536 kcal, 147 gm protein, 875 ml free water daily  DM/HTN diet education once appropriate.    NUTRITION DIAGNOSIS:   Inadequate oral intake related to inability to eat as evidenced by NPO status.  GOAL:   Patient will meet greater than or equal to 90% of their needs  MONITOR:   TF tolerance, Labs  REASON FOR ASSESSMENT:   Consult, Ventilator Enteral/tube feeding initiation and management  ASSESSMENT:   Pt with no known PMH admitted 9/19 with large IPH with extension suspect due to uncontrolled HTN.   9/19 admitted; intubated  Pt out of room for MRI. Remains intubated. No family present. No neurosurgery planned at this time. Plan to start TF if remains intubated.   Patient is currently intubated on ventilator support MV: 8.1 L/min Temp (24hrs), Avg:99.4 F (37.4 C), Min:99.1 F (37.3 C), Max:99.8 F (37.7 C)   Medications reviewed and include: colace, 22 units levemir, protonix, miralax, 40 mEq KCl daily, senokot-s Fentanyl Keppra Hypertonic saline 3%  Labs reviewed: Na 147, TG: 117, A1C: 8.2 CBG's: 149-166 - transitioning off insulin drip    NUTRITION - FOCUSED PHYSICAL EXAM:  Pt unavailable, out of room for MRI.   Diet Order:   Diet Order             Diet NPO time specified  Diet effective now                   EDUCATION NEEDS:   Not appropriate for education at this time  Skin:  Skin Assessment: Reviewed RN Assessment  Last BM:  unknown  Height:   Ht Readings from Last 1 Encounters:  07/10/21 5\' 9"  (1.753 m)    Weight:   Wt Readings from Last 1 Encounters:  2021/07/26 110 kg    BMI:  Body mass index is 35.81 kg/m.  Estimated Nutritional Needs:   Kcal:  1500  Protein:  > 131 grams  Fluid:  > 1.5  L/day  07/15/2021., RD, LDN, CNSC See AMiON for contact information

## 2021-07-10 NOTE — Progress Notes (Signed)
No major events or new problems overnight.  Patient remains hypertensive on drips.  Remains intubated.  Sedation has been lifted.  Patient will follow some commands on the right side.  Plegic on the left.  Follow-up head CT scan demonstrates stable appearance for right basal ganglia hemorrhage with minimal ventricular extension and no evidence of significant hydrocephalus.  Large right-sided hypertensive hemorrhage.  Continue supportive efforts and hypertonic saline.  No recommendations from our standpoint.  No indication for any type of surgical intervention.

## 2021-07-10 NOTE — Progress Notes (Signed)
eLink Physician-Brief Progress Note Patient Name: Katherine Hardin DOB: 10/09/1976 MRN: [id number]   Date of Service  07/10/2021  HPI/Events of Note  Asked to address glucose level of 196; I note pt received 3%NS and an Na level was drawn from near the infusion and was reported as being 170's.  Repeat draw is pending  eICU Interventions  Await repeat Na level to assess accuracy prior to addressing glucose level     Intervention Category Intermediate Interventions: Electrolyte abnormality - evaluation and management  Jacinta Shoe 07/10/2021, 5:51 AM

## 2021-07-10 NOTE — Progress Notes (Signed)
PT Cancellation Note  Patient Details Name: [name] MRN: [id number] DOB: 04/14/1976   Cancelled Treatment:    Reason Eval/Treat Not Completed: Active bedrest order. Please update orders when pt medically ready. PT will check back.   Lyanne Co, PT  Acute Rehab Services  Pager [phone] Office [phone]    Lawana Chambers Simaya Lumadue 07/10/2021, 8:21 AM

## 2021-07-10 NOTE — Progress Notes (Signed)
OT Cancellation Note  Patient Details Name: Katherine Hardin MRN: [id number] DOB: [birth year]   Cancelled Treatment:    Reason Eval/Treat Not Completed: Active bedrest order OT order received and appreciated however this conflicts with current bedrest order set. Please increase activity tolerance as appropriate and remove bedrest from orders. . Please contact OT at [phone] if bed rest order is discontinued. OT will hold evaluation at this time and will check back as time allows pending increased activity orders.    Wynona Neat, OTR/L  Acute Rehabilitation Services Pager: [phone] Office: [phone] .  07/10/2021, 7:21 AM

## 2021-07-10 NOTE — Progress Notes (Signed)
MRI scheduled for 1400. RT notified.   Sherral Hammers RN

## 2021-07-10 NOTE — Progress Notes (Signed)
  Echocardiogram 2D Echocardiogram has been performed.  Katherine Hardin 07/10/2021, 11:10 AM

## 2021-07-10 NOTE — Progress Notes (Signed)
NAME:  Katherine Hardin, MRN:  [id number], DOB:  30-Apr-1976, LOS: 1 ADMISSION DATE:  06/26/2021, CONSULTATION DATE:  07/06/2021 REFERRING MD:  Thomasena Edis CHIEF COMPLAINT:  AMS   History of Present Illness:  Katherine Hardin is a 45 y.o. female who has a PMH with know PMH.  She presented to [facility] ED 9/19 with AMS.  She was working at [employer] when she was found slumped over around 1210.  She also had aphasia, facial droop and left sided weakness.  EMS was called and BP extremely elevated at 290/150.  She was transported to ED and vomited en route, no obvious aspiration per ED staff.  In ED, she required intubation for AMS and airway protection.  CTH showed large IPH with intraventricular extension and 32mm MLS.   She was started on Propofol, Fentanyl, Cleviprex.  BP was still elevated so she received 2 doses of Labetalol with improvement.  After intubation, she had intermittent posturing of BUE and BLE though would occasionally reach up for ETT with RUE.  She does not follow commands but is on sedation.  Per EDP notes, husband states she has a hx of headaches for which she takes Excedrin migraine and Tylenol occasionally.  She also takes ASA 81mg  daily.  Pertinent  Medical History:  has Encephalopathy acute; Respiratory failure (HCC); Hypokalemia; Hyperglycemia; and Intracerebral hemorrhage on their problem list.  Significant Hospital Events: Including procedures, antibiotic start and stop dates in addition to other pertinent events   9/19 > admit.  Interim History / Subjective:  Remains on vent and sedation.  Does open eyes to voice and moves RUE and RLE.  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  Objective:  Blood pressure 123/64, pulse 89, temperature 99.5 F (37.5 C), temperature source Axillary, resp. rate 18, weight 110 kg, SpO2 97 %.    Vent Mode: PRVC FiO2 (%):  [40 %-100 %] 40 % Set Rate:  [18 bmp] 18 bmp Vt Set:  [520 mL] 520 mL PEEP:  [5 cmH20] 5 cmH20 Plateau Pressure:  [13 cmH20-17 cmH20] 17 cmH20    Intake/Output Summary (Last 24 hours) at 07/10/2021 0810 Last data filed at 07/10/2021 0700 Gross per 24 hour  Intake 3158.74 ml  Output 1700 ml  Net 1458.74 ml    Filed Weights   07/10/2021 1450  Weight: 110 kg    Examination: General: Young adult female, critically ill, in NAD. Neuro: Sedated, Opens eyes and moves RUE and RLE. HEENT: [place]/AT. Sclerae anicteric. ETT in place. Cardiovascular: RRR, no M/R/G.  Lungs: Respirations even and unlabored.  CTA bilaterally, No W/R/R.  Abdomen: BS x 4, soft, NT/ND.  Musculoskeletal: No gross deformities, no edema.  Skin: Intact, warm, no rashes.  Labs/imaging personally reviewed:  [facility] 9/19 > acute IPH in right basal ganglia estimated volume 72cc with IV extension, mass effect with 25mm R to L MLS. CTH 9/20 >  MRI brain 9/20 >   Resolved Hospital Problem list:    Assessment & Plan:   Large IPH with IV extension - presumed hypertensive in nature.  No indication for EVD per neurosurgery.  She is s/p mannitol 80g in ED. - Management per neuro. - Continue Cleviprex with goal SBP < 140. - Empiric Keppra 1g BID. - Continue 3% saline per neuro. - F/u on MRI brain.  Hypertensive emergency. - Cleviprex as above. - Add Amlodipine, Lisinopril. - Labetalol PRN above goal. - Assess echo. - Will need home anti-hypertensive regimen upon discharge. - Diet and exercise counseling.  Respiratory insufficiency - due  to inability to protect the airway in the setting of above. - Full vent support. - Assess ABG. - Wean as able as long as BP stays within range. - Daily SBT. - Bronchial hygiene. - Follow CXR. - No role abx at this point as doubt any aspiration (did have vomiting episode en route but no obvious aspiration per ED staff). - Need to consider starting TF's if unable to extubate soon.  Hyperglycemia - was started on insulin gtt overnight, now controlled at 163. A1c elevated at 8.2 - Transition off insulin gtt, Levemir 22u BID per  protocol. - Needs outpatient PCP follow up for ongoing DM management. - Diet counseling.  Hypertriglyceridemia - was on Propofol + Cleviprex, Propofol now off and trigs much improved. - Continue to hold Propofol. - Transition insulin gtt to off. - Monitor trigs.   Best practice (evaluated daily):  Diet/type: NPO DVT prophylaxis: SCD GI prophylaxis: PPI Lines: N/A Foley:  Yes, and it is still needed Code Status:  full code Last date of multidisciplinary goals of care discussion: Per primary.  Critical care time: 40 min.   Rutherford Guys, PA - [facility] For pager details, please see AMION or use Epic chat  After 1900, please call ELINK for cross coverage needs 07/10/2021, 8:10 AM

## 2021-07-10 NOTE — Progress Notes (Signed)
Pt was transported to MRI via Ventilator. No complication arose and Patient is stable and is now at 4N15C. RT will continue to monitor.

## 2021-07-10 NOTE — Progress Notes (Signed)
Inpatient Diabetes Program Recommendations  AACE/ADA: New Consensus Statement on Inpatient Glycemic Control (2015)  Target Ranges:  Prepandial:   less than 140 mg/dL      Peak postprandial:   less than 180 mg/dL (1-2 hours)      Critically ill patients:  140 - 180 mg/dL   Lab Results  Component Value Date   GLUCAP 161 (H) 07/10/2021   HGBA1C 8.2 (H) 07/19/2021    Review of Glycemic Control Results for DELORIS, MOGER (MRN [id number]) as of 07/10/2021 10:39  Ref. Range 07/10/2021 06:52 07/10/2021 07:56 07/10/2021 08:56 07/10/2021 09:53  Glucose-Capillary Latest Ref Range: 70 - 99 mg/dL 226 (H) 333 (H) 545 (H) 159 (H)   Diabetes history: Type 2 DM, new onset? Outpatient Diabetes medications: none Current orders for Inpatient glycemic control: Levemir 22 units QD  Inpatient Diabetes Program Recommendations:    Consider adding Novolog 2-6 units Q4H.    Thanks, Lujean Rave, MSN, RNC-OB Diabetes Coordinator [phone] (8a-5p)

## 2021-07-10 NOTE — Progress Notes (Deleted)
Patient CBG 163 at 0750 check. CCM notified. We will now transition patient off of insulin gtt. No dextrose containing IV fluids will be ordered due to patient being on 3% hypertonic saline. VSS.  Sherral Hammers, RN

## 2021-07-10 NOTE — Progress Notes (Signed)
SLP Cancellation Note  Patient Details Name: NAKYA WEYAND MRN: [id number] DOB: Dec 10, 1975   Cancelled treatment:        Reason eval/treat not completed: Pt not medically ready; still orally intubated. SLP will continue to follow.   Jeannie Done, SLP-Student   [place] Albert Hersch 07/10/2021, 8:55 AM

## 2021-07-10 NOTE — Progress Notes (Signed)
Disregard BMP from 0339 due to possible contamination.  Labs redrawn at 0534.

## 2021-07-10 NOTE — Progress Notes (Addendum)
Patient CBG repeatedly in the 160s. CCM Dr Merrily Pew and Rahul PA-C notified. Will give 22 units of levemir once it arrives from pharmacy then 2 hours later will d/c insulin gtt. No dextrose containing fluids will be started due to patient being on 3% hypertonic saline. VSS.  Levemir given at 0920. Will stop insulin gtt at 1120.   Sherral Hammers, RN

## 2021-07-11 ENCOUNTER — Inpatient Hospital Stay (HOSPITAL_COMMUNITY): Payer: Medicaid Other | Admitting: Anesthesiology

## 2021-07-11 ENCOUNTER — Encounter (HOSPITAL_COMMUNITY): Payer: Self-pay | Admitting: Neurology

## 2021-07-11 ENCOUNTER — Encounter (HOSPITAL_COMMUNITY): Admission: EM | Disposition: E | Payer: Self-pay | Source: Home / Self Care | Attending: Neurology

## 2021-07-11 ENCOUNTER — Inpatient Hospital Stay (HOSPITAL_COMMUNITY): Payer: Medicaid Other

## 2021-07-11 DIAGNOSIS — R739 Hyperglycemia, unspecified: Secondary | ICD-10-CM

## 2021-07-11 HISTORY — PX: CRANIOTOMY: SHX93

## 2021-07-11 HISTORY — PX: APPLICATION OF CRANIAL NAVIGATION: SHX6578

## 2021-07-11 LAB — GLUCOSE, CAPILLARY
Glucose-Capillary: 223 mg/dL — ABNORMAL HIGH (ref 70–99)
Glucose-Capillary: 236 mg/dL — ABNORMAL HIGH (ref 70–99)
Glucose-Capillary: 293 mg/dL — ABNORMAL HIGH (ref 70–99)
Glucose-Capillary: 270 mg/dL — ABNORMAL HIGH (ref 70–99)
Glucose-Capillary: 208 mg/dL — ABNORMAL HIGH (ref 70–99)
Glucose-Capillary: 273 mg/dL — ABNORMAL HIGH (ref 70–99)

## 2021-07-11 LAB — BASIC METABOLIC PANEL
Anion gap: 7 (ref 5–15)
BUN: 20 mg/dL (ref 6–20)
CO2: 23 mmol/L (ref 22–32)
Calcium: 9 mg/dL (ref 8.9–10.3)
Chloride: 125 mmol/L — ABNORMAL HIGH (ref 98–111)
Creatinine, Ser: 1 mg/dL (ref 0.44–1.00)
GFR, Estimated: >60 mL/min (ref >60)
Glucose, Bld: 239 mg/dL — ABNORMAL HIGH (ref 70–99)
Potassium: 3.8 mmol/L (ref 3.5–5.1)
Sodium: 155 mmol/L — ABNORMAL HIGH (ref 135–145)

## 2021-07-11 LAB — CBC
HCT: 33.7 % — ABNORMAL LOW (ref 36.0–46.0)
Hemoglobin: 10 g/dL — ABNORMAL LOW (ref 12.0–15.0)
MCH: 25.6 pg — ABNORMAL LOW (ref 26.0–34.0)
MCHC: 29.7 g/dL — ABNORMAL LOW (ref 30.0–36.0)
MCV: 86.2 fL (ref 80.0–100.0)
Platelets: 207 10*3/uL (ref 150–400)
RBC: 3.91 MIL/uL (ref 3.87–5.11)
RDW: 17.1 % — ABNORMAL HIGH (ref 11.5–15.5)
WBC: 13.9 10*3/uL — ABNORMAL HIGH (ref 4.0–10.5)
nRBC: 0 % (ref 0.0–0.2)

## 2021-07-11 LAB — LIPID PANEL
Cholesterol: 177 mg/dL (ref 0–200)
HDL: 37 mg/dL — ABNORMAL LOW (ref >40)
LDL Cholesterol: 104 mg/dL — ABNORMAL HIGH (ref 0–99)
Total CHOL/HDL Ratio: 4.8 RATIO
Triglycerides: 180 mg/dL — ABNORMAL HIGH (ref <150)
VLDL: 36 mg/dL (ref 0–40)

## 2021-07-11 LAB — SODIUM
Sodium: 156 mmol/L — ABNORMAL HIGH (ref 135–145)
Sodium: 154 mmol/L — ABNORMAL HIGH (ref 135–145)
Sodium: 155 mmol/L — ABNORMAL HIGH (ref 135–145)

## 2021-07-11 SURGERY — CRANIOTOMY HEMATOMA EVACUATION SUBDURAL
Anesthesia: General | Site: Head | Laterality: Right

## 2021-07-11 MED ORDER — LABETALOL HCL 5 MG/ML IV SOLN
20.0000 mg | INTRAVENOUS | Status: DC | PRN
  Administered 2021-07-11 – 2021-07-12 (×5): 20 mg via INTRAVENOUS
  Filled 2021-07-11 (×5): qty 4

## 2021-07-11 MED ORDER — PROPOFOL 10 MG/ML IV BOLUS
INTRAVENOUS | Status: DC | PRN
  Administered 2021-07-11: 70 mg via INTRAVENOUS

## 2021-07-11 MED ORDER — INSULIN DETEMIR 100 UNIT/ML ~~LOC~~ SOLN
28.0000 [IU] | Freq: Two times a day (BID) | SUBCUTANEOUS | Status: DC
  Administered 2021-07-11 – 2021-07-13 (×4): 28 [IU] via SUBCUTANEOUS
  Filled 2021-07-11 (×7): qty 0.28

## 2021-07-11 MED ORDER — BACITRACIN ZINC 500 UNIT/GM EX OINT
TOPICAL_OINTMENT | CUTANEOUS | Status: DC | PRN
  Administered 2021-07-11: 1 via TOPICAL

## 2021-07-11 MED ORDER — ROCURONIUM BROMIDE 10 MG/ML (PF) SYRINGE
PREFILLED_SYRINGE | INTRAVENOUS | Status: DC | PRN
  Administered 2021-07-11: 100 mg via INTRAVENOUS

## 2021-07-11 MED ORDER — INSULIN ASPART 100 UNIT/ML IJ SOLN
3.0000 [IU] | INTRAMUSCULAR | Status: DC
  Administered 2021-07-12 – 2021-07-13 (×8): 3 [IU] via SUBCUTANEOUS

## 2021-07-11 MED ORDER — CEFAZOLIN SODIUM-DEXTROSE 2-4 GM/100ML-% IV SOLN
2.0000 g | INTRAVENOUS | Status: AC
  Administered 2021-07-11: 2 g via INTRAVENOUS

## 2021-07-11 MED ORDER — THROMBIN 20000 UNITS EX SOLR: CUTANEOUS | Status: DC | PRN

## 2021-07-11 MED ORDER — DEXAMETHASONE SODIUM PHOSPHATE 10 MG/ML IJ SOLN
INTRAMUSCULAR | Status: AC
  Filled 2021-07-11: qty 1

## 2021-07-11 MED ORDER — SODIUM CHLORIDE 0.9 % IV SOLN: INTRAVENOUS | Status: DC | PRN

## 2021-07-11 MED ORDER — HYDRALAZINE HCL 10 MG PO TABS
10.0000 mg | ORAL_TABLET | Freq: Once | ORAL | Status: DC
  Filled 2021-07-11: qty 1

## 2021-07-11 MED ORDER — LIDOCAINE-EPINEPHRINE 1 %-1:100000 IJ SOLN
INTRAMUSCULAR | Status: DC | PRN
  Administered 2021-07-11: 5 mL

## 2021-07-11 MED ORDER — THROMBIN 5000 UNITS EX SOLR
CUTANEOUS | Status: AC
  Filled 2021-07-11: qty 5000

## 2021-07-11 MED ORDER — LIDOCAINE-EPINEPHRINE 1 %-1:100000 IJ SOLN
INTRAMUSCULAR | Status: AC
  Filled 2021-07-11: qty 1

## 2021-07-11 MED ORDER — CLONIDINE HCL 0.2 MG PO TABS
0.3000 mg | ORAL_TABLET | Freq: Three times a day (TID) | ORAL | Status: DC
  Filled 2021-07-11: qty 1

## 2021-07-11 MED ORDER — HYDRALAZINE HCL 25 MG PO TABS: 25.0000 mg | ORAL_TABLET | Freq: Three times a day (TID) | ORAL | Status: DC

## 2021-07-11 MED ORDER — THROMBIN 20000 UNITS EX SOLR
CUTANEOUS | Status: AC
  Filled 2021-07-11: qty 20000

## 2021-07-11 MED ORDER — ROCURONIUM BROMIDE 10 MG/ML (PF) SYRINGE
PREFILLED_SYRINGE | INTRAVENOUS | Status: AC
  Filled 2021-07-11: qty 10

## 2021-07-11 MED ORDER — PROPOFOL 10 MG/ML IV BOLUS
INTRAVENOUS | Status: AC
  Filled 2021-07-11: qty 20

## 2021-07-11 MED ORDER — MIDAZOLAM HCL 5 MG/5ML IJ SOLN
INTRAMUSCULAR | Status: DC | PRN
  Administered 2021-07-11: 2 mg via INTRAVENOUS

## 2021-07-11 MED ORDER — HYDROCHLOROTHIAZIDE 25 MG PO TABS
25.0000 mg | ORAL_TABLET | Freq: Every day | ORAL | Status: DC
  Administered 2021-07-11 – 2021-07-12 (×2): 25 mg
  Filled 2021-07-11 (×2): qty 1

## 2021-07-11 MED ORDER — HYDRALAZINE HCL 10 MG PO TABS
10.0000 mg | ORAL_TABLET | Freq: Once | ORAL | Status: AC
  Administered 2021-07-11: 10 mg
  Filled 2021-07-11: qty 1

## 2021-07-11 MED ORDER — CLONIDINE HCL 0.2 MG PO TABS
0.3000 mg | ORAL_TABLET | Freq: Three times a day (TID) | ORAL | Status: DC
  Administered 2021-07-11 – 2021-07-13 (×7): 0.3 mg
  Filled 2021-07-11 (×6): qty 1

## 2021-07-11 MED ORDER — BACITRACIN ZINC 500 UNIT/GM EX OINT
TOPICAL_OINTMENT | CUTANEOUS | Status: AC
  Filled 2021-07-11: qty 28.35

## 2021-07-11 MED ORDER — THROMBIN 5000 UNITS EX SOLR: OROMUCOSAL | Status: DC | PRN

## 2021-07-11 MED ORDER — INSULIN ASPART 100 UNIT/ML IJ SOLN
0.0000 [IU] | INTRAMUSCULAR | Status: DC
  Administered 2021-07-11: 11 [IU] via SUBCUTANEOUS
  Administered 2021-07-11: 7 [IU] via SUBCUTANEOUS
  Administered 2021-07-12: 4 [IU] via SUBCUTANEOUS
  Administered 2021-07-12 (×2): 7 [IU] via SUBCUTANEOUS
  Administered 2021-07-12: 11 [IU] via SUBCUTANEOUS
  Administered 2021-07-12: 3 [IU] via SUBCUTANEOUS
  Administered 2021-07-12: 7 [IU] via SUBCUTANEOUS
  Administered 2021-07-13: 3 [IU] via SUBCUTANEOUS
  Administered 2021-07-13: 7 [IU] via SUBCUTANEOUS
  Administered 2021-07-13: 4 [IU] via SUBCUTANEOUS

## 2021-07-11 MED ORDER — MIDAZOLAM HCL 2 MG/2ML IJ SOLN
INTRAMUSCULAR | Status: AC
  Filled 2021-07-11: qty 2

## 2021-07-11 MED ORDER — 0.9 % SODIUM CHLORIDE (POUR BTL) OPTIME
TOPICAL | Status: DC | PRN
  Administered 2021-07-11: 2000 mL

## 2021-07-11 MED ORDER — HYDRALAZINE HCL 50 MG PO TABS
50.0000 mg | ORAL_TABLET | Freq: Three times a day (TID) | ORAL | Status: DC
  Administered 2021-07-11: 50 mg
  Filled 2021-07-11: qty 1

## 2021-07-11 SURGICAL SUPPLY — 44 items
BAG COUNTER SPONGE SURGICOUNT (BAG) ×4 IMPLANT
BLADE CLIPPER SURG (BLADE) ×2 IMPLANT
BUR ACORN 9.0 PRECISION (BURR) ×2 IMPLANT
BUR SPIRAL ROUTER 2.3 (BUR) ×2 IMPLANT
CANISTER SUCT 3000ML PPV (MISCELLANEOUS) ×4 IMPLANT
CANNULA SUCTION SHARP 30D (CANNULA) ×2 IMPLANT
DRAPE NEUROLOGICAL W/INCISE (DRAPES) ×2 IMPLANT
DRAPE SHEET LG 3/4 BI-LAMINATE (DRAPES) ×2 IMPLANT
DRAPE WARM FLUID 44X44 (DRAPES) ×2 IMPLANT
DURAPREP 6ML APPLICATOR 50/CS (WOUND CARE) ×2 IMPLANT
ELECT REM PT RETURN 9FT ADLT (ELECTROSURGICAL) ×2
ELECTRODE REM PT RTRN 9FT ADLT (ELECTROSURGICAL) ×1 IMPLANT
GAUZE 4X4 16PLY ~~LOC~~+RFID DBL (SPONGE) ×2 IMPLANT
GLOVE SURG LTX SZ7.5 (GLOVE) ×4 IMPLANT
GLOVE SURG UNDER POLY LF SZ7.5 (GLOVE) ×4 IMPLANT
GOWN STRL REUS W/ TWL LRG LVL3 (GOWN DISPOSABLE) ×2 IMPLANT
GOWN STRL REUS W/ TWL XL LVL3 (GOWN DISPOSABLE) ×2 IMPLANT
GOWN STRL REUS W/TWL 2XL LVL3 (GOWN DISPOSABLE) IMPLANT
GOWN STRL REUS W/TWL LRG LVL3 (GOWN DISPOSABLE) ×2
GOWN STRL REUS W/TWL XL LVL3 (GOWN DISPOSABLE) ×2
HEMOSTAT POWDER KIT SURGIFOAM (HEMOSTASIS) ×2 IMPLANT
INTRODUCER ENDO MINOP 6.1X6 (INTRODUCER) ×2 IMPLANT
KIT BASIN OR (CUSTOM PROCEDURE TRAY) ×2 IMPLANT
KIT TURNOVER KIT B (KITS) ×2 IMPLANT
MARKER SPHERE PSV REFLC 13MM (MARKER) ×4 IMPLANT
NEEDLE HYPO 22GX1.5 SAFETY (NEEDLE) ×2 IMPLANT
NS IRRIG 1000ML POUR BTL (IV SOLUTION) ×2 IMPLANT
PACK CRANIOTOMY CUSTOM (CUSTOM PROCEDURE TRAY) ×2 IMPLANT
SPONGE SURGIFOAM ABS GEL 100 (HEMOSTASIS) ×2 IMPLANT
STAPLER VISISTAT 35W (STAPLE) ×2 IMPLANT
STOCKINETTE 6  STRL (DRAPES) ×1
STOCKINETTE 6 STRL (DRAPES) ×1 IMPLANT
SUT MON AB 3-0 SH 27 (SUTURE) ×1
SUT MON AB 3-0 SH27 (SUTURE) ×1 IMPLANT
SUT VIC AB 0 CT1 18XCR BRD8 (SUTURE) ×1 IMPLANT
SUT VIC AB 0 CT1 8-18 (SUTURE) ×1
SUT VIC AB 3-0 SH 8-18 (SUTURE) ×2 IMPLANT
TOWEL GREEN STERILE (TOWEL DISPOSABLE) ×2 IMPLANT
TOWEL GREEN STERILE FF (TOWEL DISPOSABLE) ×2 IMPLANT
TRAY FOLEY MTR SLVR 16FR STAT (SET/KITS/TRAYS/PACK) ×2 IMPLANT
TUBE CONNECTING 12X1/4 (SUCTIONS) ×2 IMPLANT
TUBING EXTENTION W/L.L. (IV SETS) ×6 IMPLANT
UNDERPAD 30X36 HEAVY ABSORB (UNDERPADS AND DIAPERS) ×2 IMPLANT
WATER STERILE IRR 1000ML POUR (IV SOLUTION) ×2 IMPLANT

## 2021-07-11 NOTE — Progress Notes (Signed)
Dr Merrily Pew placed patient on wean mode on ventilator. Verbal order that patient is okay to stay on wean mode unless she drops below 88% SpO2. RT notified. Patient currently 93%.   Sherral Hammers, RN

## 2021-07-11 NOTE — Progress Notes (Signed)
NAME:  Katherine Hardin, MRN:  [id number], DOB:  05/23/1976, LOS: 2 ADMISSION DATE:  11-Jul-2021, CONSULTATION DATE:  07/02/2021 REFERRING MD:  Thomasena Edis CHIEF COMPLAINT:  AMS   History of Present Illness:  Katherine Hardin is a 45 y.o. female who has a PMH with know PMH.  She presented to [facility] ED 9/19 with AMS.  She was working at [employer] when she was found slumped over around 1210.  She also had aphasia, facial droop and left sided weakness.  EMS was called and BP extremely elevated at 290/150.  She was transported to ED and vomited en route, no obvious aspiration per ED staff.  In ED, she required intubation for AMS and airway protection.  CTH showed large IPH with intraventricular extension and 52mm MLS.   She was started on Propofol, Fentanyl, Cleviprex.  BP was still elevated so she received 2 doses of Labetalol with improvement.  After intubation, she had intermittent posturing of BUE and BLE though would occasionally reach up for ETT with RUE.  She does not follow commands but is on sedation.  Per EDP notes, husband states she has a hx of headaches for which she takes Excedrin migraine and Tylenol occasionally.  She also takes ASA 81mg  daily.  Significant Hospital Events: Including procedures, antibiotic start and stop dates in addition to other pertinent events   9/19 > admit.  Interim History / Subjective:  Remained encephalopathic Tolerating spontaneous breathing trial but mental status remain poor Blood pressure is not well controlled still requiring clevidipine infusion  Objective:  Blood pressure 140/71, pulse 85, temperature 99.5 F (37.5 C), temperature source Axillary, resp. rate 18, height 5\' 9"  (1.753 m), weight 110 kg, SpO2 92 %.    Vent Mode: PRVC FiO2 (%):  [40 %] 40 % Set Rate:  [18 bmp] 18 bmp Vt Set:  [520 mL] 520 mL PEEP:  [5 cmH20] 5 cmH20 Plateau Pressure:  [16 cmH20-20 cmH20] 16 cmH20   Intake/Output Summary (Last 24 hours) at 07/05/2021 0915 Last data filed at  06/24/2021 0800 Gross per 24 hour  Intake 1998.84 ml  Output 600 ml  Net 1398.84 ml   Filed Weights   07/05/2021 1450  Weight: 110 kg    Examination: General: Critically ill-appearing morbidly obese African-American female, orally intubated HEENT: [place]/AT, eyes anicteric.  ETT and OGT in place Neuro: Opens closed, does not open, not following commands, antigravity on right side, completely plegic left side Chest: Coarse breath sounds, no wheezes or rhonchi Heart: Regular rate and rhythm, no murmurs or gallops Abdomen: Soft, nontender, nondistended, bowel sounds present Skin: No rash  Labs/imaging personally reviewed:  [facility] 9/19 > acute IPH in right basal ganglia estimated volume 72cc with IV extension, mass effect with 52mm R to L MLS. CTH 9/20 >  MRI brain 9/20 > 7.4 x 3.4 x 5.5 cm acute parenchymal hemorrhage centered within the right basal ganglia, not significantly changed in size as compared to the head CT performed yesterday at 7:58 p.m. Unchanged extension of blood products into the lateral, third and fourth ventricles. No evidence of hydrocephalus or ventricular entrapment at this time.   Unchanged mass effect with partial effacement of the right lateral ventricle, and 8 mm leftward midline shift measured at the level of the septum pellucidum. There is early right uncal herniation with subtle mass effect upon the right aspect of the brainstem.   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 Continue neuro watch every hour 3% saline was stopped as serum sodium is at goal of  150-155 SBP goal <140 Still requiring Cleviprex, titrated down Continue amlodipine, lisinopril.  Restarted on clonidine 0.3 mg 3 times daily Continue Keppra for seizure prophylaxis Keep head end of the bed elevated Stroke team is following  Acute hypoxic respiratory failure  Continue lung protective ventilation Patient is tolerating spontaneous breathing trial Mental status has not improved, that precludes extubation She is on minimal sedation with fentanyl RASS goal 0/-1  New diagnosis of diabetes type 2 with hyperglycemia Patient blood sugars are not well controlled Increase Levemir to 26 units twice daily Continue sliding scale insulin with CBG goal 140-180 A1c elevated at 8.2  Hypertriglyceridemia - was on Propofol + Cleviprex, Propofol now off and trigs much improved. Closely monitor  Obesity Dietitian follow-up Best practice (evaluated daily):  Diet/type: NPO tube feeds DVT prophylaxis: SCD GI prophylaxis: PPI Lines: N/A Foley:  Yes, and it is still needed Code Status:  full code Last date of multidisciplinary goals of care discussion: Per primary.  Total critical care time: 41 minutes  Performed by: Cheri Fowler   Critical care time was exclusive of separately billable procedures and treating other patients.   Critical care was necessary to treat or prevent imminent or life-threatening deterioration.   Critical care was time spent personally by me on the following activities: development of treatment plan with patient and/or surrogate as well as nursing, discussions with consultants, evaluation of patient's response to treatment, examination of patient, obtaining history from patient or surrogate, ordering and performing treatments and interventions, ordering and review of laboratory studies, ordering and review of radiographic studies, pulse oximetry and re-evaluation of patient's condition.   Cheri Fowler MD [facility] See Amion for  pager If no response to pager, please call [phone] until 7pm After 7pm, Please call E-link [phone]

## 2021-07-11 NOTE — Progress Notes (Signed)
PT Cancellation Note  Patient Details Name: Katherine Hardin MRN: [id number] DOB: 05-13-1976   Cancelled Treatment:    Reason Eval/Treat Not Completed: Active bedrest order  Marcena Dias A. Dan Humphreys PT, DPT Acute Rehabilitation Services Pager [phone] Office [phone]    Viviann Spare 07/10/2021, 7:37 AM

## 2021-07-11 NOTE — Progress Notes (Signed)
Pt's SBP elevated since beginning of shift.  PRN labetalol 10 mg IV given several times with no effect.  Contacted MD multiple times throughout the night to discuss pt's BP and medication.  New orders given.  New medication orders were not successful at bringing pts BP within goal.  Cleviprex drip started at 0430.

## 2021-07-11 NOTE — Progress Notes (Signed)
Tube feeding turned off around 1130 in anticipation for surgery this afternoon.

## 2021-07-11 NOTE — Progress Notes (Signed)
Transported pt to CT scan and back to 4N15 without complication.

## 2021-07-11 NOTE — Transfer of Care (Signed)
Immediate Anesthesia Transfer of Care Note  Patient: Katherine Hardin  Procedure(s) Performed: CRANIOTOMY HEMATOMA EVACUATION (Right: Head) APPLICATION OF CRANIAL NAVIGATION (Right: Head) ENDOSCOPIC APPROACH (Right: Head)  Patient Location: PACU and ICU  Anesthesia Type:General  Level of Consciousness: Patient remains intubated per anesthesia plan  Airway & Oxygen Therapy: Patient placed on Ventilator (see vital sign flow sheet for setting)  Post-op Assessment: Report given to RN  Post vital signs: Reviewed and stable  Last Vitals:  Vitals Value Taken Time  BP 133/78 06/23/2021 1807  Temp    Pulse 89 06/24/2021 1818  Resp    SpO2 93 % 07/07/2021 1818  Vitals shown include unvalidated device data.  Last Pain:  Vitals:   07/12/2021 1500  TempSrc: Axillary         Complications: No notable events documented.

## 2021-07-11 NOTE — Progress Notes (Signed)
SLP Cancellation Note  Patient Details Name: Katherine Hardin MRN: [id number] DOB: 04-Dec-1975   Cancelled treatment:        Reason Eval/Treat Not Completed: Pt not medically ready; orally intubated. SLP will follow for pt readiness.   Jeannie Done, SLP-Student    [place] [name] 07/10/2021, 7:56 AM

## 2021-07-11 NOTE — Op Note (Signed)
PATIENT: Katherine Hardin  DAY OF SURGERY: 06/21/2021   PRE-OPERATIVE DIAGNOSIS:  Intracerebral hemorrhage   POST-OPERATIVE DIAGNOSIS:  Same   PROCEDURE:  Right sided craniotomy for endoscopic evacuation of intracerebral hemorrhage   SURGEON:  Surgeon(s) and Role:    Jadene Pierini, MD - Primary   ANESTHESIA: ETGA   BRIEF HISTORY: This is a 45 year old woman who presented with a large right BG ICH. She had progressive decline in her exam with increasing midline shift, I therefore offered an endoscopic clot evacuation. This was discussed with the patient's family as well as risks, benefits, and alternatives and wished to proceed with surgery.   OPERATIVE DETAIL: The patient was taken to the operating room and placed on the OR table in the supine position. A formal time out was performed with two patient identifiers and confirmed the operative site. Anesthesia was induced by the anesthesia team. The [place] head holder was applied to the head and a registration array was attached to the [place]. This was co-registered with the patient's preoperative imaging, the fit appeared to be acceptable. Using frameless stereotaxy, the operative trajectory was planned and the incision was marked. Hair was clipped with surgical clippers over the incision and the area was then prepped and draped in a sterile fashion.  A linear incision was placed in the right posterior scalp using the brainlab probe to localize the area of pre-planned trajectory. A drill was used to make a small craniectomy large enough to fit the endoscope as well as have some freedom of manipulation of the scope. The dura was coagulated and divided. The probe was used to guide the endoscope sheath into the anterior portion of the clot with release of blood products under high pressure. The endoscope was advanced into the sheath and clot was removed. There was no obvious active bleeding within view of the endoscope. After removal of clot  that was easily expressable with suction, I felt we had a good evacuation and removed the endoscope and sheath.   All instrument and sponge counts were correct, gelforam was placed into the craniotomy defect and compressed to help with hemostasis over the corticotomy and prevent rundown. The incision was then closed in layers. The patient was then returned to anesthesia for emergence. No apparent complications at the completion of the procedure.   EBL:  5mL   DRAINS: none   SPECIMENS: none   Jadene Pierini, MD 07/05/2021 6:04 PM

## 2021-07-11 NOTE — Progress Notes (Signed)
STROKE TEAM PROGRESS NOTE   INTERVAL HISTORY Her sister and mom at the bedside.  Patient remains intubated and on cleviprex.  Patient not open eyes or follow commands as yesterday.  More obtunded today.  MRI yesterday showed stable hematoma and slightly increased mass-effect with early right uncal herniation.  Off 3% saline, on tube feeding, sodium 155.  Discussed with Dr. Johnsie Cancel, will considering minimally invasive hematoma evacuation.  Vitals:   08/01/2021 1000 08/01/21 1100 08-01-2021 1139 01-Aug-2021 1200  BP: 136/71 132/74  137/72  Pulse: 91 86  80  Resp: 18 18  18   Temp:   100.3 F (37.9 C)   TempSrc:   Axillary   SpO2: 93% 91% 96% 94%  Weight:      Height:       CBC:  Recent Labs  Lab 07/05/2021 1254 07/11/2021 1303 07/10/21 0603 Aug 01, 2021 0659  WBC 14.7*  --  12.7* 13.9*  NEUTROABS 8.7*  --   --   --   HGB 12.9   < > 9.6* 10.0*  HCT 42.1   < > 32.5* 33.7*  MCV 82.2  --  85.3 86.2  PLT 323  --  239 207   < > = values in this interval not displayed.   Basic Metabolic Panel:  Recent Labs  Lab 06/21/2021 1611 06/25/2021 1851 07/10/21 0534 07/10/21 1229 2021-08-01 0220 Aug 01, 2021 0659  NA 139   < > 147*   < > 156* 155*  K  --    < > 3.8  --   --  3.8  CL  --    < > 115*  --   --  125*  CO2  --    < > 24  --   --  23  GLUCOSE  --    < > 188*  --   --  239*  BUN  --    < > 9  --   --  20  CREATININE  --    < > 0.86  --   --  1.00  CALCIUM  --    < > 8.4*  --   --  9.0  MG 2.1  --   --   --   --   --   PHOS 3.3  --   --   --   --   --    < > = values in this interval not displayed.   Lipid Panel:  Recent Labs  Lab 08-01-21 0659  CHOL 177  TRIG 180*  HDL 37*  CHOLHDL 4.8  VLDL 36  LDLCALC 07/07/2021*   HgbA1c:  Recent Labs  Lab 07/12/2021 1611  HGBA1C 8.2*   Urine Drug Screen:  Recent Labs  Lab 06/27/2021 1631  LABOPIA NONE DETECTED  COCAINSCRNUR NONE DETECTED  LABBENZ NONE DETECTED  AMPHETMU NONE DETECTED  THCU NONE DETECTED  LABBARB NONE DETECTED    Alcohol  Level  Recent Labs  Lab 06/26/2021 1603  ETH <10    IMAGING past 24 hours MR BRAIN WO CONTRAST  Result Date: 07/10/2021 CLINICAL DATA:  Stroke, follow-up. EXAM: MRI HEAD WITHOUT CONTRAST TECHNIQUE: Multiplanar, multiecho pulse sequences of the brain and surrounding structures were obtained without intravenous contrast. COMPARISON:  Prior head CT examinations 06/27/2021. CT angiogram head/neck 07/12/2021. FINDINGS: Brain: Cerebral volume is normal. Redemonstrated acute parenchymal hemorrhage centered within the right basal ganglia. The parenchymal hemorrhage measures 7.4 x 3.4 x 5.5 cm (AP x TV x CC), not significantly changed in size from  the head CT performed yesterday at 7:58 p.m. (remeasured on prior). As before, there is extension of blood products into the lateral, third and fourth ventricles. Intraventricular blood volume does not appear significantly changed. Edema surrounding the parenchymal hemorrhage with partial effacement of the right lateral ventricle. No evidence of ventricular entrapment. 8 mm leftward midline shift measured at the level of the septum pellucidum, unchanged. There is early right uncal herniation with subtle mass effect upon the right aspect of the brainstem (series 5, image 10). No acute ischemic infarct. No appreciable intracranial mass. No cortical encephalomalacia is identified. No significant cerebral white matter disease. 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 Vascular: Maintained flow voids within the proximal large arterial vessels. Skull and upper cervical spine: No focal suspicious marrow lesion. Sinuses/Orbits: Visualized orbits show no acute finding. No significant paranasal sinus disease. Impression #2 will be called to the ordering clinician or representative by the Radiologist Assistant, and communication documented in the PACS or [employer]. IMPRESSION: 7.4 x 3.4 x 5.5 cm acute  parenchymal hemorrhage centered within the right basal ganglia, not significantly changed in size as compared to the head CT performed yesterday at 7:58 p.m. Unchanged extension of blood products into the lateral, third and fourth ventricles. No evidence of hydrocephalus or ventricular entrapment at this time. Unchanged mass effect with partial effacement of the right lateral ventricle, and 8 mm leftward midline shift measured at the level of the septum pellucidum. There is early right uncal herniation with subtle mass effect upon the right aspect of the brainstem. 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 Electronically Signed   By: Jackey Loge D.O.   On: 07/10/2021 15:39   CT BRAINLAB HEAD W/O CONTRAST ( )  Result Date: 06/28/2021 CLINICAL DATA:  BRAINLAB PROTOCOL.  STROKE.  HEMORRHAGE. EXAM: CT HEAD WITHOUT CONTRAST TECHNIQUE: Contiguous axial images were obtained from the base of the skull through the vertex without intravenous contrast. COMPARISON:  CT HEAD WITHOUT CONTRAST 07-14-2021 AND MR HEAD WITHOUT CONTRAST 07/10/2021 FINDINGS: Brain: Right parenchymal hemorrhage stable in size measuring 6.8 x 4.4 x 3.1 cm. Effacement of the right lateral ventricle again noted. Layering intraventricular hemorrhage again noted. Mild prominence of the left lateral ventricle stable. Midline shift unchanged. Cerebellar tonsils are above the foramen magnum. Minimal blood is present the fourth ventricle. No new hemorrhage is present. No significant extra-axial fluid is present. Vascular: No hyperdense vessel or unexpected calcification. Skull: Calvarium is intact. No focal lytic or blastic lesions are present. No significant extracranial soft tissue lesion is present. Sinuses/Orbits: The paranasal sinuses and mastoid air cells are clear. The globes and orbits are within normal limits. Other: Endotracheal tube and orogastric tube are in place.  IMPRESSION: 1. Stable size of right parenchymal hemorrhage with mass effect and midline shift. 2. Stable intraventricular hemorrhage. 3. Stable mild prominence of the left lateral ventricle. Electronically Signed   By: Marin Roberts M.D.   On: 07/17/2021 12:04    Physical Exam   Temp:  [99.2 F (37.3 C)-100.3 F (37.9 C)] 100.3 F (37.9 C) (09/21 1139) Pulse Rate:  [65-98] 80 (09/21 1200) Resp:  [15-24] 18 (09/21 1200) BP: (88-165)/(44-84) 137/72 (09/21 1200) SpO2:  [88 %-100 %] 94 % (09/21 1200) FiO2 (%):  [40 %-50 %] 50 % (09/21 1200)  General - Well nourished, well developed, intubated on sedation.  Ophthalmologic - fundi not visualized due to noncooperation.  Cardiovascular - Regular  rate and rhythm.  Neuro - intubated on fentanyl, eyes closed, not following commands. With forced eye opening, eyes in need position, not blinking to visual threat, doll's eyes sluggish, not tracking, pupil equal size however sluggish to light. Corneal reflex present, gag and cough present. Breathing over the vent.  Facial symmetry not able to test due to ET tube.  Tongue protrusion not cooperative. On pain stimulation, moving right upper extremity against gravity and right lower extremity 2+/5.  Left hemiplegia with instrumentation.  Bilateral babinski positive. Sensation, coordination and gait not tested.   ASSESSMENT/PLAN Katherine Hardin is a 45 y.o. female female with an unknown PMHx as only note in Epic is a delivery 10 years ago. Patient was brought in by EMS as a code stroke. Patient was working at [employer] today, when at 1210, a co worker witnessed her slumping over. Patient had aphasia, facial droop and left neglect with weakness. BP 290/150. CBG 211. She vomited in route as well and was unresponsive when arrived. After brief exam at the ED bridge, patient was taken to ED 8 and intubated emergently. Propofol was started after intubation. Cleviprex was started after bleed confirmed.   ICH:   right BG large hemorrhage secondary to hypertension.  CT showed large right BG ICH with IVH, midline shift around 9 mm.   CTA head and neck no AVM or aneurysm.   CT repeat stable large right BG ICH and midline shift.   MRI large right ICH, no underlying malignancy, midline shift around 8 mm.   EF 65 to 70%, severe LV hypertrophy.   A1c 8.2 LDL 104    UDS negative VTE prophylaxis - SCD's aspirin 81 mg daily prior to admission, now on No antithrombotic. D/t ICH Therapy recommendations:  pending Disposition:  pending  Cerebral edema MRI and CT showed midline shift around 8 mm On 3% saline -> off Na 155->157->155 Dr. Dutch Quint on board, also discussed with Dr. Johnsie Cancel, will consider minimally invasive hematoma evacuation  Respiratory failure Intubated on vent CCM on board Not a candidate for extubation at this time  Hypertensive emergency Home meds:  none on Cleviprex drip  Currently on clonidine, amlodipine and lisinopril Goal SBP < 160 Long-term BP goal normotensive  Hyperlipidemia Home meds:  none LDL 104, goal < 70 Hold off statin for now due to ICH Consider statin at discharge  Diabetes type II Uncontrolled Home meds:  none HgbA1c 8.2, goal < 7.0 Hyperglycemia CBGs SSI On Levemir CCM on board  Other Stroke Risk Factors   Other acute issues Leukocytosis WBC 12.7-13.9  Hospital day # 2  This patient is critically ill due to large right BG ICH, IVH, cerebral edema, hypertensive emergency, uncontrolled diabetes, respiratory failure and at significant risk of neurological worsening, death form brain herniation, brain mass, DKA, seizure, hypertensive encephalopathy, obstructive hydrocephalus. This patient's care requires constant monitoring of vital signs, hemodynamics, respiratory and cardiac monitoring, review of multiple databases, neurological assessment, discussion with family, other specialists and medical decision making of high complexity. I spent 50 minutes  of neurocritical care time in the care of this patient. I had long discussion with mom and sister at bedside, updated pt current condition, treatment plan and potential prognosis, and answered all the questions.  They expressed understanding and appreciation.  I also discussed with Dr. Johnsie Cancel.  Marvel Plan, MD PhD Stroke Neurology 07-26-2021 1:13 PM      To contact Stroke Continuity provider, please refer to [email or web address]. After hours, contact General Neurology

## 2021-07-11 NOTE — Consult Note (Signed)
Neurosurgery Consultation  Reason for Consult: ICH Referring Physician: Roda Shutters  CC: ICH  HPI: This is a 45 y.o. woman that presented with left sided weakness and progressive AMS. I was consulted due to worsening mental status with edema developing around the ICH. Yesterday she was following commands and unfortunately has worsened, no longer following commands.   ROS: A 14 point ROS was performed and is negative except as noted in the HPI.   PMHx: No past medical history on file. FamHx: No family history on file. SocHx:  has no history on file for tobacco use, alcohol use, and drug use.  Exam: Vital signs in last 24 hours: Temp:  [99.2 F (37.3 C)-99.8 F (37.7 C)] 99.5 F (37.5 C) (09/21 0716) Pulse Rate:  [65-98] 91 (09/21 1000) Resp:  [15-24] 18 (09/21 1000) BP: (88-165)/(44-84) 136/71 (09/21 1000) SpO2:  [88 %-100 %] 93 % (09/21 1000) FiO2 (%):  [40 %] 40 % (09/21 0805) General: Lying in ICU bed, apperas acutely ill Head: Normocephalic and atruamatic HEENT: Neck supple Pulmonary: intubated, ETT secured, good chest rise b/l Cardiac: RRR, mildly tachy Abdomen: S NT ND Extremities: Warm and well perfused x4 Neuro: Intubated, eyes closed to stim, PERRL 35mm, gaze conjugate, +corneals/cough/gag, w/d on R to painful stim, 0/5 on L  Assessment and Plan: 45 y.o. woman w/ large ICH. CTH/MRI personally reviewed, which shows 60+cc R BG ICH with IVH, no ventriculomegaly with 1cm of midline shift and significant brain compression  -discussed w/ the pt's family, I think she is a good candidate for minimally invasive ICH evacuation, discussed r/b/a and they would like to proceed. Will get a volumetric CTH today and then take to the OR for clot evacuation  Jadene Pierini, MD 07/19/2021 10:36 AM [facility]

## 2021-07-11 NOTE — Anesthesia Preprocedure Evaluation (Addendum)
Anesthesia Evaluation  Patient identified by MRN, date of birth, ID band  Reviewed: Allergy & Precautions, H&P , NPO status , Patient's Chart, lab work & pertinent test results  Airway Mallampati: Intubated       Dental   Pulmonary neg pulmonary ROS,    breath sounds clear to auscultation       Cardiovascular +CHF (grade 2 diastolic dysfunction)   Rhythm:regular Rate:Normal  Echo:  1. Left ventricular ejection fraction, by estimation, is 65 to 70%. The  left ventricle has normal function. The left ventricle has no regional  wall motion abnormalities. There is severe concentric left ventricular  hypertrophy. Left ventricular diastolic  parameters are consistent with Grade II diastolic dysfunction  (pseudonormalization). Elevated left ventricular end-diastolic pressure.  2. Right ventricular systolic function is normal. The right ventricular  size is normal. Tricuspid regurgitation signal is inadequate for assessing  PA pressure.  3. Left atrial size was mild to moderately dilated.  4. The mitral valve is normal in structure. No evidence of mitral valve  regurgitation. No evidence of mitral stenosis.  5. The aortic valve is normal in structure. Aortic valve regurgitation is  not visualized. No aortic stenosis is present.  6. The inferior vena cava is normal in size with <50% respiratory  variability, suggesting right atrial pressure of 8 mmHg.    Neuro/Psych Intracerebral hemorrhage 2/2 uncontrolled HTN  negative psych ROS   GI/Hepatic negative GI ROS, Neg liver ROS,   Endo/Other  Obesity BMI 36  Renal/GU negative Renal ROS  negative genitourinary   Musculoskeletal negative musculoskeletal ROS (+)   Abdominal   Peds  Hematology  (+) Blood dyscrasia, anemia , hct 33.7   Anesthesia Other Findings   Reproductive/Obstetrics negative OB ROS                          Anesthesia  Physical Anesthesia Plan  ASA: 3 and emergent  Anesthesia Plan: General   Post-op Pain Management:    Induction: Intravenous  PONV Risk Score and Plan: 4 or greater and Ondansetron, Dexamethasone and Treatment may vary due to age or medical condition  Airway Management Planned: Oral ETT  Additional Equipment: Arterial line  Intra-op Plan:   Post-operative Plan: Post-operative intubation/ventilation  Informed Consent:   Plan Discussed with: CRNA  Anesthesia Plan Comments: (Access: 8.0 ETT, PICC, PIV x 1)       Anesthesia Quick Evaluation

## 2021-07-11 NOTE — Progress Notes (Signed)
Neurosurgery Service Post-operative progress note  Assessment & Plan: 45 y.o. woman s/p endoscopic ICH evac.  -return to 4N ICU intubated -will order [facility] to evaluate extent of clot removal and midline shift -hold SQH until 9/23 -remainder of care per primary team  Jadene Pierini  06/30/2021 6:11 PM

## 2021-07-11 NOTE — Progress Notes (Signed)
OT Cancellation Note  Patient Details Name: Katherine Hardin MRN: [id number] DOB: Aug 22, 1976   Cancelled Treatment:    Reason Eval/Treat Not Completed: Active bedrest order  [facility] Morrell Fluke, OT/L   Acute OT Clinical Specialist Acute Rehabilitation Services Pager [phone] Office [phone]  07/19/2021, 7:07 AM

## 2021-07-11 NOTE — Progress Notes (Signed)
I witnessed Dr Maurice Small explain risks and benefits of right endoscopic evacuation to patient's mother and father at bedside and patient's husband (next of kin) on the phone. All questions and concerns answered from all 3 family members. Mother signed surgical consent form since husband cannot physically be here at this time. Consent placed in patient's paper chart.   Sherral Hammers RN

## 2021-07-11 NOTE — Plan of Care (Addendum)
Hypertension Home meds:  none Unstable on admission, improved on Cleviprex drip, c/b hypertriglyceridemia, now transitioning to oral meds with PRN labetalol  Goal SBP < 140 Long-term BP goal normotensive Already on amlodipine 10 mg and linsinopril 40 mg daily started 9/20 AM Requiring frequent PRN labetalol 10 mg IV overnight --> increased to 20 mg IV PRN due to limited effect  Asked nursing to confirm line patency for drug administration as well as BP cuff  Added hydralazine 10 mg PO once with little effect, administered another 10 mg with plans to start 25 mg q8hr Given persistently elevated BP, will increase hydral to 50 mg standing after giving another 10 mg PO now  Brooke Dare MD-PhD [facility] [phone]  Available 7 PM to 7 AM, outside of these hours please call Neurologist on call as listed on Amion.

## 2021-07-12 ENCOUNTER — Inpatient Hospital Stay (HOSPITAL_COMMUNITY): Payer: Medicaid Other | Admitting: Certified Registered Nurse Anesthetist

## 2021-07-12 ENCOUNTER — Inpatient Hospital Stay (HOSPITAL_COMMUNITY): Payer: Medicaid Other

## 2021-07-12 ENCOUNTER — Encounter (HOSPITAL_COMMUNITY): Payer: Self-pay | Admitting: Neurological Surgery

## 2021-07-12 ENCOUNTER — Encounter (HOSPITAL_COMMUNITY): Admission: EM | Disposition: E | Payer: Self-pay | Source: Home / Self Care | Attending: Neurology

## 2021-07-12 DIAGNOSIS — G935 Compression of brain: Secondary | ICD-10-CM

## 2021-07-12 DIAGNOSIS — I161 Hypertensive emergency: Secondary | ICD-10-CM

## 2021-07-12 HISTORY — PX: APPLICATION OF CRANIAL NAVIGATION: SHX6578

## 2021-07-12 HISTORY — PX: CRANIOTOMY: SHX93

## 2021-07-12 LAB — GLUCOSE, CAPILLARY
Glucose-Capillary: 226 mg/dL — ABNORMAL HIGH (ref 70–99)
Glucose-Capillary: 246 mg/dL — ABNORMAL HIGH (ref 70–99)
Glucose-Capillary: 145 mg/dL — ABNORMAL HIGH (ref 70–99)
Glucose-Capillary: 186 mg/dL — ABNORMAL HIGH (ref 70–99)
Glucose-Capillary: 235 mg/dL — ABNORMAL HIGH (ref 70–99)

## 2021-07-12 LAB — BASIC METABOLIC PANEL
Anion gap: 8 (ref 5–15)
BUN: 25 mg/dL — ABNORMAL HIGH (ref 6–20)
BUN: 31 mg/dL — ABNORMAL HIGH (ref 6–20)
CO2: 20 mmol/L — ABNORMAL LOW (ref 22–32)
CO2: 20 mmol/L — ABNORMAL LOW (ref 22–32)
Calcium: 8.5 mg/dL — ABNORMAL LOW (ref 8.9–10.3)
Calcium: 7.7 mg/dL — ABNORMAL LOW (ref 8.9–10.3)
Chloride: 125 mmol/L — ABNORMAL HIGH (ref 98–111)
Chloride: >130 mmol/L (ref 98–111)
Creatinine, Ser: 1.44 mg/dL — ABNORMAL HIGH (ref 0.44–1.00)
Creatinine, Ser: 1.51 mg/dL — ABNORMAL HIGH (ref 0.44–1.00)
GFR, Estimated: 45 mL/min — ABNORMAL LOW (ref >60)
GFR, Estimated: 43 mL/min — ABNORMAL LOW (ref >60)
Glucose, Bld: 290 mg/dL — ABNORMAL HIGH (ref 70–99)
Glucose, Bld: 244 mg/dL — ABNORMAL HIGH (ref 70–99)
Potassium: 3.8 mmol/L (ref 3.5–5.1)
Potassium: 3.5 mmol/L (ref 3.5–5.1)
Sodium: 153 mmol/L — ABNORMAL HIGH (ref 135–145)
Sodium: 154 mmol/L — ABNORMAL HIGH (ref 135–145)

## 2021-07-12 LAB — SODIUM
Sodium: 153 mmol/L — ABNORMAL HIGH (ref 135–145)
Sodium: 159 mmol/L — ABNORMAL HIGH (ref 135–145)
Sodium: 158 mmol/L — ABNORMAL HIGH (ref 135–145)

## 2021-07-12 LAB — CBC
HCT: 34.2 % — ABNORMAL LOW (ref 36.0–46.0)
Hemoglobin: 10 g/dL — ABNORMAL LOW (ref 12.0–15.0)
MCH: 25.4 pg — ABNORMAL LOW (ref 26.0–34.0)
MCHC: 29.2 g/dL — ABNORMAL LOW (ref 30.0–36.0)
MCV: 86.8 fL (ref 80.0–100.0)
Platelets: 192 10*3/uL (ref 150–400)
RBC: 3.94 MIL/uL (ref 3.87–5.11)
RDW: 17.1 % — ABNORMAL HIGH (ref 11.5–15.5)
WBC: 13.8 10*3/uL — ABNORMAL HIGH (ref 4.0–10.5)
nRBC: 0 % (ref 0.0–0.2)

## 2021-07-12 SURGERY — CRANIOTOMY HEMATOMA EVACUATION EPIDURAL
Anesthesia: General | Laterality: Right

## 2021-07-12 MED ORDER — EMPTY CONTAINERS FLEXIBLE MISC
0.5000 mg/min | Status: DC
  Administered 2021-07-12: 0.5 mg/min via INTRAVENOUS
  Administered 2021-07-12: 3.5 mg/min via INTRAVENOUS
  Administered 2021-07-12 – 2021-07-13 (×2): 2.5 mg/min via INTRAVENOUS
  Administered 2021-07-13: 4.5 mg/min via INTRAVENOUS
  Administered 2021-07-13 (×2): 2.5 mg/min via INTRAVENOUS
  Filled 2021-07-12 (×10): qty 80

## 2021-07-12 MED ORDER — SODIUM CHLORIDE 23.4 % INJECTION (4 MEQ/ML) FOR IV ADMINISTRATION
120.0000 meq | Freq: Once | INTRAVENOUS | Status: DC
  Filled 2021-07-12: qty 30

## 2021-07-12 MED ORDER — PROPOFOL 1000 MG/100ML IV EMUL
INTRAVENOUS | Status: AC
  Filled 2021-07-12: qty 100

## 2021-07-12 MED ORDER — SODIUM CHLORIDE 3 % IV BOLUS: 500.0000 mL | Freq: Once | INTRAVENOUS | Status: DC

## 2021-07-12 MED ORDER — MANNITOL 25 % IV SOLN
50.0000 g | Freq: Once | Status: AC
  Administered 2021-07-12: 50 g via INTRAVENOUS
  Filled 2021-07-12: qty 200

## 2021-07-12 MED ORDER — CLEVIDIPINE 50 MG/100ML IV EMUL: 1.0000 mg/h | INTRAVENOUS | Status: DC

## 2021-07-12 MED ORDER — MANNITOL 25 % IV SOLN
INTRAVENOUS | Status: AC
  Filled 2021-07-12: qty 50

## 2021-07-12 MED ORDER — THROMBIN 20000 UNITS EX SOLR
CUTANEOUS | Status: AC
  Filled 2021-07-12: qty 20000

## 2021-07-12 MED ORDER — PROPOFOL 10 MG/ML IV BOLUS: 1.0000 mg/kg | Freq: Once | INTRAVENOUS | Status: DC

## 2021-07-12 MED ORDER — LIDOCAINE-EPINEPHRINE 1 %-1:100000 IJ SOLN
INTRAMUSCULAR | Status: DC | PRN
  Administered 2021-07-12: 7 mL

## 2021-07-12 MED ORDER — PROPOFOL 1000 MG/100ML IV EMUL
5.0000 ug/kg/min | INTRAVENOUS | Status: DC
  Administered 2021-07-12: 5 ug/kg/min via INTRAVENOUS
  Filled 2021-07-12: qty 100

## 2021-07-12 MED ORDER — MANNITOL 25 % IV SOLN
110.0000 g | Freq: Once | Status: DC
  Filled 2021-07-12: qty 440

## 2021-07-12 MED ORDER — CEFAZOLIN SODIUM-DEXTROSE 2-3 GM-%(50ML) IV SOLR
INTRAVENOUS | Status: DC | PRN
  Administered 2021-07-12: 2 g via INTRAVENOUS

## 2021-07-12 MED ORDER — MIDAZOLAM HCL 2 MG/2ML IJ SOLN
INTRAMUSCULAR | Status: DC | PRN
  Administered 2021-07-12: 2 mg via INTRAVENOUS

## 2021-07-12 MED ORDER — MANNITOL 25 % IV SOLN
110.0000 g | Freq: Once | Status: DC
  Filled 2021-07-12 (×2): qty 440

## 2021-07-12 MED ORDER — SODIUM CHLORIDE 3 % IV SOLN
INTRAVENOUS | Status: DC
  Filled 2021-07-12: qty 500

## 2021-07-12 MED ORDER — SODIUM CHLORIDE 3 % IV BOLUS
250.0000 mL | Freq: Once | INTRAVENOUS | Status: AC
  Administered 2021-07-12: 250 mL via INTRAVENOUS
  Filled 2021-07-12: qty 500

## 2021-07-12 MED ORDER — PROPOFOL 10 MG/ML IV BOLUS
INTRAVENOUS | Status: AC
  Filled 2021-07-12: qty 40

## 2021-07-12 MED ORDER — MANNITOL 25 % IV SOLN
50.0000 g | Freq: Once | Status: DC
  Administered 2021-07-12: 50 g via INTRAVENOUS
  Filled 2021-07-12: qty 200

## 2021-07-12 MED ORDER — FENTANYL CITRATE (PF) 250 MCG/5ML IJ SOLN
INTRAMUSCULAR | Status: DC | PRN
  Administered 2021-07-12: 50 ug via INTRAVENOUS
  Administered 2021-07-12: 100 ug via INTRAVENOUS
  Administered 2021-07-12 (×2): 50 ug via INTRAVENOUS

## 2021-07-12 MED ORDER — PROPOFOL 10 MG/ML IV BOLUS
0.5000 mg/kg | Freq: Once | INTRAVENOUS | Status: AC
  Administered 2021-07-12: 41.9 mg via INTRAVENOUS
  Filled 2021-07-12: qty 20

## 2021-07-12 MED ORDER — FENTANYL CITRATE (PF) 250 MCG/5ML IJ SOLN
INTRAMUSCULAR | Status: AC
  Filled 2021-07-12: qty 5

## 2021-07-12 MED ORDER — MIDAZOLAM HCL 2 MG/2ML IJ SOLN
INTRAMUSCULAR | Status: AC
  Filled 2021-07-12: qty 2

## 2021-07-12 MED ORDER — BACITRACIN ZINC 500 UNIT/GM EX OINT
TOPICAL_OINTMENT | CUTANEOUS | Status: DC | PRN
  Administered 2021-07-12 (×2): 1 via TOPICAL

## 2021-07-12 MED ORDER — SODIUM CHLORIDE 0.9 % IV SOLN: INTRAVENOUS | Status: DC | PRN

## 2021-07-12 MED ORDER — THROMBIN 5000 UNITS EX SOLR
CUTANEOUS | Status: AC
  Filled 2021-07-12: qty 5000

## 2021-07-12 MED ORDER — PROPOFOL 10 MG/ML IV BOLUS
INTRAVENOUS | Status: DC | PRN
  Administered 2021-07-12: 100 mg via INTRAVENOUS

## 2021-07-12 MED ORDER — ONDANSETRON HCL 4 MG/2ML IJ SOLN
INTRAMUSCULAR | Status: DC | PRN
  Administered 2021-07-12: 4 mg via INTRAVENOUS

## 2021-07-12 MED ORDER — HEMOSTATIC AGENTS (NO CHARGE) OPTIME
TOPICAL | Status: DC | PRN
  Administered 2021-07-12: 1 via TOPICAL

## 2021-07-12 MED ORDER — MANNITOL 25 % IV SOLN
INTRAVENOUS | Status: AC
  Administered 2021-07-12: 110 g
  Filled 2021-07-12: qty 100

## 2021-07-12 MED ORDER — THROMBIN 5000 UNITS EX SOLR
OROMUCOSAL | Status: DC | PRN
  Administered 2021-07-12: 5 mL via TOPICAL

## 2021-07-12 MED ORDER — CLEVIDIPINE BUTYRATE 0.5 MG/ML IV EMUL
1.0000 mg/h | INTRAVENOUS | Status: DC
  Filled 2021-07-12: qty 100

## 2021-07-12 MED ORDER — THROMBIN 20000 UNITS EX SOLR
CUTANEOUS | Status: DC | PRN
  Administered 2021-07-12: 20 mL via TOPICAL

## 2021-07-12 MED ORDER — ROCURONIUM BROMIDE 10 MG/ML (PF) SYRINGE
PREFILLED_SYRINGE | INTRAVENOUS | Status: DC | PRN
  Administered 2021-07-12: 100 mg via INTRAVENOUS

## 2021-07-12 MED ORDER — HEMOSTATIC AGENTS (NO CHARGE) OPTIME: TOPICAL | Status: DC | PRN

## 2021-07-12 MED ORDER — LIDOCAINE-EPINEPHRINE 1 %-1:100000 IJ SOLN
INTRAMUSCULAR | Status: AC
  Filled 2021-07-12: qty 1

## 2021-07-12 MED ORDER — BACITRACIN ZINC 500 UNIT/GM EX OINT
TOPICAL_OINTMENT | CUTANEOUS | Status: AC
  Filled 2021-07-12: qty 28.35

## 2021-07-12 MED ORDER — 0.9 % SODIUM CHLORIDE (POUR BTL) OPTIME
TOPICAL | Status: DC | PRN
  Administered 2021-07-12: 3000 mL

## 2021-07-12 MED ORDER — MANNITOL 25 % IV SOLN
60.0000 g | Freq: Once | Status: DC
  Administered 2021-07-12: 60 g via INTRAVENOUS
  Filled 2021-07-12: qty 240

## 2021-07-12 SURGICAL SUPPLY — 61 items
BAG COUNTER SPONGE SURGICOUNT (BAG) ×4 IMPLANT
BAG SPNG CNTER NS LX DISP (BAG) ×2
BLADE CLIPPER SURG (BLADE) ×2 IMPLANT
BNDG GAUZE ELAST 4 BULKY (GAUZE/BANDAGES/DRESSINGS) IMPLANT
BUR ACORN 9.0 PRECISION (BURR) IMPLANT
BUR MATCHSTICK NEURO 3.0 LAGG (BURR) IMPLANT
BUR SPIRAL ROUTER 2.3 (BUR) IMPLANT
CANISTER SUCT 3000ML PPV (MISCELLANEOUS) ×2 IMPLANT
CLIP VESOCCLUDE MED 6/CT (CLIP) IMPLANT
DRAPE NEUROLOGICAL W/INCISE (DRAPES) ×2 IMPLANT
DRAPE SHEET LG 3/4 BI-LAMINATE (DRAPES) ×2 IMPLANT
DRAPE SURG 17X23 STRL (DRAPES) IMPLANT
DRAPE WARM FLUID 44X44 (DRAPES) ×2 IMPLANT
DURAPREP 6ML APPLICATOR 50/CS (WOUND CARE) ×2 IMPLANT
ELECT REM PT RETURN 9FT ADLT (ELECTROSURGICAL) ×2
ELECTRODE REM PT RTRN 9FT ADLT (ELECTROSURGICAL) ×1 IMPLANT
EVACUATOR 1/8 PVC DRAIN (DRAIN) IMPLANT
EVACUATOR SILICONE 100CC (DRAIN) IMPLANT
GAUZE 4X4 16PLY ~~LOC~~+RFID DBL (SPONGE) ×4 IMPLANT
GAUZE SPONGE 4X4 12PLY STRL (GAUZE/BANDAGES/DRESSINGS) IMPLANT
GLOVE EXAM NITRILE LRG STRL (GLOVE) IMPLANT
GLOVE EXAM NITRILE XL STR (GLOVE) IMPLANT
GLOVE EXAM NITRILE XS STR PU (GLOVE) IMPLANT
GLOVE SURG LTX SZ7.5 (GLOVE) ×4 IMPLANT
GLOVE SURG UNDER POLY LF SZ7.5 (GLOVE) ×4 IMPLANT
GOWN STRL REUS W/ TWL LRG LVL3 (GOWN DISPOSABLE) ×2 IMPLANT
GOWN STRL REUS W/ TWL XL LVL3 (GOWN DISPOSABLE) IMPLANT
GOWN STRL REUS W/TWL 2XL LVL3 (GOWN DISPOSABLE) IMPLANT
GOWN STRL REUS W/TWL LRG LVL3 (GOWN DISPOSABLE) ×4
GOWN STRL REUS W/TWL XL LVL3 (GOWN DISPOSABLE)
HEMOSTAT POWDER KIT SURGIFOAM (HEMOSTASIS) ×2 IMPLANT
HEMOSTAT SURGICEL 2X14 (HEMOSTASIS) ×2 IMPLANT
KIT BASIN OR (CUSTOM PROCEDURE TRAY) ×2 IMPLANT
KIT TURNOVER KIT B (KITS) ×2 IMPLANT
MARKER SPHERE PSV REFLC 13MM (MARKER) ×4 IMPLANT
NEEDLE HYPO 22GX1.5 SAFETY (NEEDLE) ×2 IMPLANT
NS IRRIG 1000ML POUR BTL (IV SOLUTION) ×6 IMPLANT
PACK CRANIOTOMY CUSTOM (CUSTOM PROCEDURE TRAY) ×2 IMPLANT
PATTIES SURGICAL .5 X.5 (GAUZE/BANDAGES/DRESSINGS) IMPLANT
PATTIES SURGICAL .5 X3 (DISPOSABLE) IMPLANT
PATTIES SURGICAL 1X1 (DISPOSABLE) IMPLANT
SPONGE NEURO XRAY DETECT 1X3 (DISPOSABLE) IMPLANT
SPONGE SURGIFOAM ABS GEL 100 (HEMOSTASIS) ×2 IMPLANT
STAPLER VISISTAT 35W (STAPLE) ×2 IMPLANT
STOCKINETTE 6  STRL (DRAPES)
STOCKINETTE 6 STRL (DRAPES) IMPLANT
SUT ETHILON 3 0 FSL (SUTURE) IMPLANT
SUT ETHILON 3 0 PS 1 (SUTURE) IMPLANT
SUT MON AB 3-0 SH 27 (SUTURE) ×2
SUT MON AB 3-0 SH27 (SUTURE) ×1 IMPLANT
SUT NURALON 4 0 TR CR/8 (SUTURE) IMPLANT
SUT VIC AB 0 CT1 18XCR BRD8 (SUTURE) ×1 IMPLANT
SUT VIC AB 0 CT1 8-18 (SUTURE) ×2
SUT VIC AB 3-0 SH 8-18 (SUTURE) ×4 IMPLANT
SYR 20ML LL LF (SYRINGE) ×2 IMPLANT
TOWEL GREEN STERILE (TOWEL DISPOSABLE) ×2 IMPLANT
TOWEL GREEN STERILE FF (TOWEL DISPOSABLE) ×2 IMPLANT
TRAY FOLEY MTR SLVR 16FR STAT (SET/KITS/TRAYS/PACK) ×2 IMPLANT
TUBE CONNECTING 12X1/4 (SUCTIONS) ×2 IMPLANT
UNDERPAD 30X36 HEAVY ABSORB (UNDERPADS AND DIAPERS) ×2 IMPLANT
WATER STERILE IRR 1000ML POUR (IV SOLUTION) ×2 IMPLANT

## 2021-07-12 NOTE — Progress Notes (Signed)
PT Cancellation Note  Patient Details Name: Katherine Hardin MRN: [id number] DOB: 1975/11/03   Cancelled Treatment:    Reason Eval/Treat Not Completed: Patient not medically ready;Active bedrest order Planning for OR today. Not medically ready. Will hold and return as schedule allows.   Neesa Knapik A. Dan Humphreys PT, DPT Acute Rehabilitation Services Pager [phone] Office [phone]    Viviann Spare 07/14/2021, 8:07 AM

## 2021-07-12 NOTE — Progress Notes (Signed)
At 0430 RN noticed pts HR dropping below parameters.  RN entered room to assess pupils.  Right 6 non reactive, Left 2 sluggish.  MD called.  MD at bed side.  MD ordered 3% hypertonic saline bolus and mannitol.  Stat head CT.  Pts pupils returned to baseline before taking pt to CT.  Will continue to monitor.

## 2021-07-12 NOTE — Transfer of Care (Signed)
Immediate Anesthesia Transfer of Care Note  Patient: Katherine Hardin  Procedure(s) Performed: CRANIOTOMY FOR INTRACEREBRAL HEMATOMA (Right) APPLICATION OF CRANIAL NAVIGATION (Right)  Patient Location: ICU  Anesthesia Type:General  Level of Consciousness: sedated and unresponsive  Airway & Oxygen Therapy: Patient remains intubated per anesthesia plan and Patient placed on Ventilator (see vital sign flow sheet for setting)  Post-op Assessment: Report given to RN and Post -op Vital signs reviewed and stable  Post vital signs: Reviewed and stable  Last Vitals:  Vitals Value Taken Time  BP    Temp    Pulse    Resp    SpO2      Last Pain:  Vitals:   06/29/2021 0744  TempSrc: Axillary         Complications: No notable events documented.

## 2021-07-12 NOTE — Progress Notes (Addendum)
   07/02/2021 1410  Provider Notification  Provider Name/Title Jerel Shepherd MD  Date Provider Notified 07/17/2021  Time Provider Notified 1410  Notification Type Call  Notification Reason Other (Comment) (SBP 140s-150s)  Provider response Other (Comment) (SBP goal <160 instead of <140 now)  Date of Provider Response 07/09/2021  Time of Provider Response 1410     06/25/2021 1520  Provider Notification  Provider Name/Title J. Xu MD  Date Provider Notified 07/20/2021  Time Provider Notified 1520  Notification Type Page  Notification Reason Other (Comment) (SBP 160-170)  Provider response See new orders (labetalol gtt)  Date of Provider Response 07/07/2021  Time of Provider Response 1525     07/04/2021 1800  Provider Notification  Provider Name/Title J. Xu MD  Date Provider Notified 07/08/2021  Time Provider Notified 1800  Notification Type Page  Notification Reason Other (Comment) (sodium 159; labetalol gtt almost maxed)  Provider response See new orders (stop 3%; spoke with pharm to increase max dose on labetalol gtt)  Date of Provider Response 07/09/2021  Time of Provider Response 1800

## 2021-07-12 NOTE — Anesthesia Procedure Notes (Signed)
Date/Time: 06/24/2021 8:55 AM Performed by: Jodell Cipro, CRNA Pre-anesthesia Checklist: Patient identified, Emergency Drugs available, Suction available, Patient being monitored and Timeout performed Patient Re-evaluated:Patient Re-evaluated prior to induction Induction Type: Inhalational induction with existing ETT Placement Confirmation: positive ETCO2 Dental Injury: Teeth and Oropharynx as per pre-operative assessment  Comments: Patient transported from 4N to OR ventilated by 100%O2 ambu through OETT by CRNA, placed on vent in OR for inhalational induction. OETT remains secured in original tube holder. BBS, +ETCO2, VSS

## 2021-07-12 NOTE — Progress Notes (Signed)
STROKE TEAM PROGRESS NOTE   INTERVAL HISTORY No family is at the bedside.  Patient had minimally invasive hematoma evacuation yesterday afternoon and then this morning again.  CT head early this morning showed slightly decreased hematoma but unchanged mass-effect and midline shift.  She had right pupil enlargement overnight, resolved after 3% saline and mannitol.  Currently after second hematoma evacuation, still sedated, on ventilation.  Pupil equal size but not reactive.  Vitals:   06/25/2021 1144 07/17/2021 1147 07/04/2021 1150 06/24/2021 1155  BP: 124/70     Pulse: 83 83 82 81  Resp: 18 18 18 18   Temp:      TempSrc:      SpO2: 96% 96% 96% 95%  Weight:      Height:       CBC:  Recent Labs  Lab August 06, 2021 1254 2021-08-06 1303 06/21/2021 0659 07/07/2021 0559  WBC 14.7*   < > 13.9* 13.8*  NEUTROABS 8.7*  --   --   --   HGB 12.9   < > 10.0* 10.0*  HCT 42.1   < > 33.7* 34.2*  MCV 82.2   < > 86.2 86.8  PLT 323   < > 207 192   < > = values in this interval not displayed.   Basic Metabolic Panel:  Recent Labs  Lab 08-06-21 1611 Aug 06, 2021 1851 07/08/2021 0659 06/27/2021 1306 07/19/2021 0143 07/05/2021 0559  NA 139   < > 155*   < > 153* 153*  K  --    < > 3.8  --   --  3.8  CL  --    < > 125*  --   --  125*  CO2  --    < > 23  --   --  20*  GLUCOSE  --    < > 239*  --   --  290*  BUN  --    < > 20  --   --  25*  CREATININE  --    < > 1.00  --   --  1.44*  CALCIUM  --    < > 9.0  --   --  8.5*  MG 2.1  --   --   --   --   --   PHOS 3.3  --   --   --   --   --    < > = values in this interval not displayed.   Lipid Panel:  Recent Labs  Lab 07/01/2021 0659  CHOL 177  TRIG 180*  HDL 37*  CHOLHDL 4.8  VLDL 36  LDLCALC 06/28/2021*   HgbA1c:  Recent Labs  Lab Aug 06, 2021 1611  HGBA1C 8.2*   Urine Drug Screen:  Recent Labs  Lab 08/06/2021 1631  LABOPIA NONE DETECTED  COCAINSCRNUR NONE DETECTED  LABBENZ NONE DETECTED  AMPHETMU NONE DETECTED  THCU NONE DETECTED  LABBARB NONE DETECTED     Alcohol Level  Recent Labs  Lab 08-06-21 1603  ETH <10    IMAGING past 24 hours CT HEAD WO CONTRAST (07/19/2021)  Result Date: 07/03/2021 CLINICAL DATA:  Cerebral hemorrhage EXAM: CT HEAD WITHOUT CONTRAST TECHNIQUE: Contiguous axial images were obtained from the base of the skull through the vertex without intravenous contrast. COMPARISON:  06/21/2021. FINDINGS: Brain: New area of intraparenchymal hemorrhage in the medial right thalamus, measuring approximately 2.4 x 1.5 x 1.1 cm (series 3, image 15 and series 6, image 22), with mild surrounding edema. Redemonstrated right parenchymal hemorrhage, measuring 6.4  x 3.1 x 4.4 cm, previously 6.8 x 3.0 x 4.4 cm when remeasured similarly. Redemonstrated effacement of the right lateral ventricle. Redemonstrated hemorrhage in the lateral ventricles and fourth ventricle slightly increased prominence of the left lateral ventricle. Unchanged 8 mm of right-to-left midline shift. Overall unchanged surrounding edema. The cerebellar tonsils remain above the foramen magnum. No new area of hemorrhage no extra-axial collection. Vascular: No hyperdense vessel or unexpected calcification. Skull: Interval placement of a right parietal burr hole. Sinuses/Orbits: No acute finding. Other: None IMPRESSION: 1. New area of intraparenchymal hemorrhage in the medial right thalamus, with mild surrounding edema. 2. Overall stable to slightly decreased size of previously noted right basal ganglia intraparenchymal hemorrhage. Overall unchanged mass effect and midline shift. 3. Intraventricular hemorrhage with increased prominence of the left lateral ventricle. Attention on follow-up for worsening hydrocephalus. These results were called by telephone at the time of interpretation on 07/15/2021 at 3:34 am to provider [facility] , who verbally acknowledged these results. Electronically Signed   By: Wiliam Ke M.D.   On: 07/20/2021 03:38   CT BRAINLAB HEAD W/O CONTRAST ( )  Result  Date: 07/07/2021 CLINICAL DATA:  45 year old female code stroke presentation with large right basal ganglia hemorrhage. Postoperative day 1 endoscopic evacuation of hematoma. Right thalamic extension this morning. Subsequent encounter. EXAM: CT HEAD WITHOUT CONTRAST TECHNIQUE: Contiguous axial images were obtained from the base of the skull through the vertex without intravenous contrast. COMPARISON:  0116 hours today and earlier. FINDINGS: Brain: Large oval 70 x 30 x 51 mm (AP by transverse by CC) right basal ganglia hyperdense hemorrhage is stable to mildly decreased since 07/10/21 (estimated blood volume now 54 mL). Regional edema and mass effect not significantly changed from yesterday. Smaller, 22 mm length hyperdense hemorrhage centered in the right thalamus with surrounding edema is stable since 0116 hours today, new from yesterday. Small to moderate volume intraventricular hemorrhage is stable since yesterday. Mild left lateral ventriculomegaly is stable. Leftward midline shift 7 mm is stable since yesterday. But there is increasing cytotoxic edema in the left occipital pole, subtle yesterday and now plainly visible on series 5, image 18 and adjacent images. Also, is new cytotoxic edema in the posteromedial right parietal lobe located above the endoscopic burr hole on series 5, image 29. And there is similar but more subtle contralateral left posterior parietal lobe edema on image 27. These were subtle at 0116 hours today. Mild edema along the endoscopic course to the intra-axial hemorrhage is stable. Basilar cistern patency has not significantly changed. Vascular: No suspicious intracranial vascular hyperdensity. Skull: Right posterior parietal region burr hole redemonstrated. No acute osseous abnormality identified. Sinuses/Orbits: Visualized paranasal sinuses and mastoids are stable and well aerated. Other: Intubated and oral enteric tube in place. Mild fluid in the pharynx. Visualized orbit soft tissues  are within normal limits. Mild postoperative changes right posterior scalp overlying skull burr hole. IMPRESSION: 1. New and increasing cytotoxic edema in both posterior parietal lobes and the left occipital pole, compatible with acute and subacute ischemia. 2. Stable 2.2 cm acute hemorrhage in the right thalamus with surrounding edema since 0116 hours today. 3. Large left basal ganglia, approximately 54 mL hemorrhage is stable to slightly decreased since 07/10/2021. 4. Intracranial mass effect and leftward midline shift (7 mm) are stable. 5. Stable intraventricular hemorrhage and mild trapping of the left lateral ventricle. Electronically Signed   By: Odessa Fleming M.D.   On: 07/16/2021 06:43    Physical Exam   Temp:  [98.6  F (37 C)-101.3 F (38.5 C)] 99.2 F (37.3 C) (09/22 1104) Pulse Rate:  [49-98] 81 (09/22 1155) Resp:  [13-22] 18 (09/22 1155) BP: (115-174)/(58-99) 124/70 (09/22 1144) SpO2:  [89 %-100 %] 95 % (09/22 1155) Arterial Line BP: (138-190)/(60-74) 138/60 (09/22 1155) FiO2 (%):  [50 %-60 %] 60 % (09/22 1054)  General - Well nourished, well developed, intubated on sedation.  Ophthalmologic - fundi not visualized due to noncooperation.  Cardiovascular - Regular rate and rhythm.  Neuro - intubated, just finished procedure, still sedated, eyes closed, not following commands. With forced eye opening, eyes in mid position, not blinking to visual threat, doll's eyes absent, not tracking, pupil equal size 76mm however not reactive to light. Corneal reflex absent, gag and cough absent breathing over the vent.  Facial symmetry not able to test due to ET tube.  Tongue protrusion not cooperative. On pain stimulation, not moving all extremities. Sensation, coordination and gait not tested.   ASSESSMENT/PLAN Ms. Katherine Hardin is a 45 y.o. female female with an unknown PMHx as only note in Epic is a delivery 10 years ago. Patient was brought in by EMS as a code stroke. Patient was working at  [employer] today, when at 1210, a co worker witnessed her slumping over. Patient had aphasia, facial droop and left neglect with weakness. BP 290/150. CBG 211. She vomited in route as well and was unresponsive when arrived. After brief exam at the ED bridge, patient was taken to ED 8 and intubated emergently. Propofol was started after intubation. Cleviprex was started after bleed confirmed.   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  CT showed large right BG ICH with IVH, midline shift around 9 mm.   CTA head and neck no AVM or aneurysm.   CT repeat stable large right BG ICH and midline shift.   MRI large right ICH, no underlying malignancy, midline shift around 8 mm.   CT head 9/22 a.m. slight decrease size of hematoma, overall unchanged mass-effect and midline shift CT head 9/22 p.m. pending EF 65 to 70%, severe LV hypertrophy.   A1c 8.2 LDL 104    UDS negative VTE prophylaxis - SCD's aspirin 81 mg daily prior to admission, now on No antithrombotic. D/t ICH Therapy recommendations:  pending Disposition:  pending  Cerebral edema Brain herniation MRI and CT showed midline shift around 8 mm On 3% saline -> off -> 50cc Na 155->157->155->153 S/p minimally invasive hematoma evacuation x2 CT head 9/22 a.m. slight decrease size of hematoma, overall unchanged mass-effect and midline shift CT head 9/22 p.m. pending  Respiratory failure Intubated on vent CCM on board Not a candidate for extubation at this time  Hypertensive emergency Home meds:  none on Cleviprex drip  Currently on clonidine, HCTZ, amlodipine and lisinopril Goal SBP < 160 Long-term BP goal normotensive  Hyperlipidemia Home meds:  none LDL 104, goal < 70 Hold off statin for now due to ICH Consider statin at discharge  Diabetes type II Uncontrolled Home meds:  none HgbA1c 8.2, goal < 7.0 Hyperglycemia CBGs SSI On Levemir CCM on board  Other Stroke Risk Factors   Other acute  issues Leukocytosis WBC 12.7-13.9-13.8 AKI creatinine 1.00-> 1.44  Hospital day # 3  This patient is critically ill due to large right BG ICH, IVH, cerebral edema, hypertensive emergency, uncontrolled diabetes, respiratory failure and at significant risk of neurological worsening, death form brain herniation, brain death, seizure, DKA, obstructive hydrocephalus. This patient's care requires constant monitoring of  vital signs, hemodynamics, respiratory and cardiac monitoring, review of multiple databases, neurological assessment, discussion with family, other specialists and medical decision making of high complexity. I spent 40 minutes of neurocritical care time in the care of this patient.  I discussed with Dr. Johny Drilling CCM.   Marvel Plan, MD PhD Stroke Neurology 07/17/2021 12:57 PM      To contact Stroke Continuity provider, please refer to [email or web address]. After hours, contact General Neurology

## 2021-07-12 NOTE — Progress Notes (Signed)
Neurosurgery Service Progress Note  Subjective: Had R mydriasis overnight, improved w/ 3% bolus   Objective: Vitals:   07/11/2021 0645 07/18/2021 0700 07/04/2021 0715 07/15/2021 0744  BP: 120/66 130/71 128/71   Pulse: 91 90 85   Resp: 17 18 18    Temp:    98.6 F (37 C)  TempSrc:    Axillary  SpO2: 95% 94% 94%   Weight:      Height:        Physical Exam: Intubated, PERRL 38mm, mild corneals present, +c/g, 0/5 x4  Assessment & Plan: 45 y.o. woman s/p endoscopic ICH evac, post-op CTH w/ subtotal evac and new tract hemorrhage, now w/ clinical worsening.  -discussed w/ the pt's family at bedside about the CT findings / pupillary changes. Upon entering the hematoma in the OR, I had the usual egress of dark blood under pressure but clearly was tangent to the long axis of the clot, suspect registration issue. New vCTH obtained today, will take back to the OR now for repeat evacuation  49  07/11/2021 7:49 AM

## 2021-07-12 NOTE — Op Note (Signed)
PATIENT: Katherine Hardin  DAY OF SURGERY: 07/09/2021   PRE-OPERATIVE DIAGNOSIS:  Intracerebral hemorrhage   POST-OPERATIVE DIAGNOSIS:  Same   PROCEDURE:  Repeat right sided craniotomy for stereotactic evacuation of intracerebral hemorrhage   SURGEON:  Surgeon(s) and Role:    Jadene Pierini, MD - Primary   ANESTHESIA: ETGA   BRIEF HISTORY: This is a 45 year old woman who presented with a large right BG ICH. She had progressive decline in her exam with increasing midline shift, I therefore offered an endoscopic clot evacuation. Post-operatively, she worsened early the next morning and repeat CTH showed continued mass effect and hemorrhage, I therefore recommended repeat evacuation. This was discussed with the patient's family as well as risks, benefits, and alternatives and wished to proceed with surgery.   OPERATIVE DETAIL: The patient was taken to the operating room and placed on the OR table in the supine position. A formal time out was performed with two patient identifiers and confirmed the operative site. Anesthesia was induced by the anesthesia team. The [place] head holder was applied to the head and a registration array was attached to the [place]. This was co-registered with the patient's preoperative imaging, the fit appeared to be acceptable. Using frameless stereotaxy, the operative trajectory was planned and the incision was marked. Hair was clipped with surgical clippers over the incision and the area was then prepped and draped in a sterile fashion.  The patient's prior incision was used along with the prior crani defect. The brainlab probe was used to stereotactically enter the hematoma with return of dark blood. I then used a navigated suction, with suction off, to enter the hematoma and remove hematoma, alternating with suction and irrigation to mobilize clot as much as possible. After no clot would return, it was further irrigated until clear irrigation returned, then the  suction was removed off the suction tip and floseal was connected to the suction tip and placed into the cavity. Hemostasis was confirmed and the suction was removed.  All instrument and sponge counts were correct, gelforam was placed into the craniotomy defect and compressed to help with hemostasis over the corticotomy and prevent rundown. The incision was then closed in layers. The patient was then returned to anesthesia for emergence. No apparent complications at the completion of the procedure.   EBL:  68mL   DRAINS: none   SPECIMENS: none   Jadene Pierini, MD 07/10/2021 10:20 AM

## 2021-07-12 NOTE — Anesthesia Procedure Notes (Signed)
Arterial Line Insertion Start/End09/20/2022 8:55 AM, 07/12/2021 9:06 AM Performed by: Jodell Cipro, CRNA, CRNA  Patient location: OR. Preanesthetic checklist: patient identified, IV checked, site marked, risks and benefits discussed, surgical consent, monitors and equipment checked, pre-op evaluation and timeout performed Patient sedated Left, radial was placed Catheter size: 20 G Hand hygiene performed , maximum sterile barriers used  and Seldinger technique used Allen's test indicative of satisfactory collateral circulation Attempts: 1 Procedure performed without using ultrasound guided technique. Following insertion, dressing applied and Biopatch. Patient tolerated the procedure well with no immediate complications. Additional procedure comments: Spoke with patient's mother in ICU regarding placement of arterial line for procedure. Patient is on ventilator and unresponsive. Marland Kitchen

## 2021-07-12 NOTE — Progress Notes (Signed)
OT Cancellation Note  Patient Details Name: Katherine Hardin MRN: [id number] DOB: 1976/01/20   Cancelled Treatment:    Reason Eval/Treat Not Completed: Medical issues which prohibited therapy (Planning for OR today. Not medically ready. Will hold and return as schedule allows. Thank you.)  [name] M [name] [name] MSOT, OTR/L Acute Rehab Pager: [phone] Office: [phone] 07/16/2021, 8:05 AM

## 2021-07-12 NOTE — Anesthesia Postprocedure Evaluation (Signed)
Anesthesia Post Note  Patient: Katherine Hardin  Procedure(s) Performed: CRANIOTOMY FOR INTRACEREBRAL HEMATOMA (Right) APPLICATION OF CRANIAL NAVIGATION (Right)     Patient location during evaluation: SICU Anesthesia Type: General Level of consciousness: sedated Pain management: pain level controlled Vital Signs Assessment: post-procedure vital signs reviewed and stable Respiratory status: patient remains intubated per anesthesia plan Cardiovascular status: stable Postop Assessment: no apparent nausea or vomiting Anesthetic complications: no   No notable events documented.  Last Vitals:  Vitals:   07/26/21 1845 07/26/21 1900  BP: 117/62 118/62  Pulse: 67 67  Resp: 18 18  Temp:    SpO2: 93% 92%    Last Pain:  Vitals:   July 26, 2021 1519  TempSrc: Axillary                 Shereta Crothers DANIEL

## 2021-07-12 NOTE — Progress Notes (Signed)
Transported pt to CT scan and back to 4N15 without complications.

## 2021-07-12 NOTE — Progress Notes (Signed)
Notified by nursing about exam change with right pupil fixed and dilated   Brain code protocols initiated  Hyperventilation (rate increased from 18 to 22 for 20 min, then reduced to 18 again) 500 cc hypertonic saline 3%, followed by rate of 50 cc/hr (noted last Na 153)  Goal Na 155-165, ideally 160  50 g mannitol administered (additional held for future use if needed, total dose would be 110 g for 1 g/kg dose), May give additional 60 mg if pupil dilates again   Right pupil improved from 8 mm fixed dilated with hypertonics infusing to 3 mm sluggishly reactive, left remained 3 mm sluggishly reactive Corneals intact, weak/incomplete VOR better to the right than the left, cough present  No movement to noxious stim in all four extremities Toes mute bilaterally   Discussed role of hypertonics with family at bedside.   Dr. Maurice Small of neurosurgery notified, appreciate plan for OR in the AM, brainlab protocol head CT obtained which on my review shows grossly stable hemorrhage from post-OP head CT.    Brooke Dare MD-PhD [facility] [phone]  Available 7 PM to 7 AM, outside of these hours please call Neurologist on call as listed on Amion.     CRITICAL CARE Performed by: Gordy Councilman   Total critical care time: 40 minutes  Critical care time was exclusive of separately billable procedures and treating other patients.  Critical care was necessary to treat or prevent imminent or life-threatening deterioration.  Critical care was time spent personally by me on the following activities: development of treatment plan with patient and/or surrogate as well as nursing, discussions with consultants, evaluation of patient's response to treatment, examination of patient, obtaining history from patient or surrogate, ordering and performing treatments and interventions, ordering and review of laboratory studies, ordering and review of radiographic studies, pulse oximetry and  re-evaluation of patient's condition.

## 2021-07-12 NOTE — Anesthesia Postprocedure Evaluation (Signed)
Anesthesia Post Note  Patient: Katherine Hardin  Procedure(s) Performed: CRANIOTOMY HEMATOMA EVACUATION (Right: Head) APPLICATION OF CRANIAL NAVIGATION (Right: Head) ENDOSCOPIC APPROACH (Right: Head)     Patient location during evaluation: SICU Anesthesia Type: General Level of consciousness: sedated Pain management: pain level controlled Vital Signs Assessment: post-procedure vital signs reviewed and stable Respiratory status: patient remains intubated per anesthesia plan Cardiovascular status: stable Postop Assessment: no apparent nausea or vomiting Anesthetic complications: no   No notable events documented.  Last Vitals:  Vitals:   06/30/2021 0715 07/07/2021 0744  BP: 128/71   Pulse: 85   Resp: 18   Temp:  37 C  SpO2: 94%     Last Pain:  Vitals:   07/08/2021 0744  TempSrc: Axillary                 Trinette Vera S

## 2021-07-12 NOTE — Progress Notes (Signed)
Inpatient Diabetes Program Recommendations  AACE/ADA: New Consensus Statement on Inpatient Glycemic Control (2015)  Target Ranges:  Prepandial:   less than 140 mg/dL      Peak postprandial:   less than 180 mg/dL (1-2 hours)      Critically ill patients:  140 - 180 mg/dL   Lab Results  Component Value Date   GLUCAP 145 (H) 06/23/2021   HGBA1C 8.2 (H) 06/23/2021    Review of Glycemic Control Results for KEONIA, PASKO (MRN [id number]) as of 07/20/2021 11:27  Ref. Range 07-29-2021 23:18 07/10/2021 03:12 07/11/2021 07:41 06/24/2021 11:00  Glucose-Capillary Latest Ref Range: 70 - 99 mg/dL 757 (H) 972 (H) 820 (H) 145 (H)   Diabetes history: Type 2 DM, new onset? Outpatient Diabetes medications: none Current orders for Inpatient glycemic control: Levemir 28 units BID, Novolog 0-20 units Q4H, Novolog 3 units Q4H   Inpatient Diabetes Program Recommendations:   Once tube feeds resumed, consider increasing Novolog to 8 units Q4H (to be stopped or held in the event tube feeds are stopped).   Thanks, Lujean Rave, MSN, RNC-OB Diabetes Coordinator [phone] (8a-5p)

## 2021-07-12 NOTE — Anesthesia Preprocedure Evaluation (Addendum)
Anesthesia Evaluation  Patient identified by MRN, date of birth, ID band Patient awake    Reviewed: Allergy & Precautions, NPO status , Patient's Chart, lab work & pertinent test results  History of Anesthesia Complications Negative for: history of anesthetic complications  Airway Mallampati: Intubated       Dental   Pulmonary neg pulmonary ROS,    breath sounds clear to auscultation       Cardiovascular hypertension,  Rhythm:Regular Rate:Tachycardia  1. Left ventricular ejection fraction, by estimation, is 65 to 70%. The left ventricle has normal function. The left ventricle has no regional wall motion abnormalities. There is severe concentric left ventricular hypertrophy. Left ventricular diastolic parameters are consistent with Grade II diastolic dysfunction (pseudonormalization). Elevated left ventricular end-diastolic pressure. 2. Right ventricular systolic function is normal. The right ventricular size is normal. Tricuspid regurgitation signal is inadequate for assessing PA pressure. 3. Left atrial size was mild to moderately dilated. 4. The mitral valve is normal in structure. No evidence of mitral valve regurgitation. No evidence of mitral stenosis. 5. The aortic valve is normal in structure. Aortic valve regurgitation is not visualized. No aortic stenosis is present. 6. The inferior vena cava is normal in size with <50% respiratory variability, suggesting right atrial pressure of 8 mmHg.   Neuro/Psych CVA    GI/Hepatic negative GI ROS, Neg liver ROS,   Endo/Other  negative endocrine ROS  Renal/GU negative Renal ROS     Musculoskeletal negative musculoskeletal ROS (+)   Abdominal   Peds  Hematology negative hematology ROS (+) anemia ,   Anesthesia Other Findings   Reproductive/Obstetrics                           Anesthesia Physical Anesthesia Plan  ASA: 4 and  emergent  Anesthesia Plan: General   Post-op Pain Management:    Induction: Intravenous  PONV Risk Score and Plan: 3 and Treatment may vary due to age or medical condition and Ondansetron  Airway Management Planned: Oral ETT  Additional Equipment: Arterial line  Intra-op Plan:   Post-operative Plan: Post-operative intubation/ventilation  Informed Consent: I have reviewed the patients History and Physical, chart, labs and discussed the procedure including the risks, benefits and alternatives for the proposed anesthesia with the patient or authorized representative who has indicated his/her understanding and acceptance.     Dental advisory given  Plan Discussed with: Anesthesiologist and CRNA  Anesthesia Plan Comments:        Anesthesia Quick Evaluation

## 2021-07-13 ENCOUNTER — Encounter (HOSPITAL_COMMUNITY): Payer: Self-pay | Admitting: Neurological Surgery

## 2021-07-13 LAB — BASIC METABOLIC PANEL
BUN: 30 mg/dL — ABNORMAL HIGH (ref 6–20)
CO2: 22 mmol/L (ref 22–32)
Calcium: 8.9 mg/dL (ref 8.9–10.3)
Chloride: >130 mmol/L (ref 98–111)
Creatinine, Ser: 1.47 mg/dL — ABNORMAL HIGH (ref 0.44–1.00)
GFR, Estimated: 44 mL/min — ABNORMAL LOW (ref >60)
Glucose, Bld: 211 mg/dL — ABNORMAL HIGH (ref 70–99)
Potassium: 3.4 mmol/L — ABNORMAL LOW (ref 3.5–5.1)
Sodium: 168 mmol/L (ref 135–145)

## 2021-07-13 LAB — CBC
HCT: 31.1 % — ABNORMAL LOW (ref 36.0–46.0)
Hemoglobin: 8.9 g/dL — ABNORMAL LOW (ref 12.0–15.0)
MCH: 25.4 pg — ABNORMAL LOW (ref 26.0–34.0)
MCHC: 28.6 g/dL — ABNORMAL LOW (ref 30.0–36.0)
MCV: 88.9 fL (ref 80.0–100.0)
Platelets: 172 10*3/uL (ref 150–400)
RBC: 3.5 MIL/uL — ABNORMAL LOW (ref 3.87–5.11)
RDW: 17.7 % — ABNORMAL HIGH (ref 11.5–15.5)
WBC: 9 10*3/uL (ref 4.0–10.5)
nRBC: 0 % (ref 0.0–0.2)

## 2021-07-13 LAB — GLUCOSE, CAPILLARY
Glucose-Capillary: 130 mg/dL — ABNORMAL HIGH (ref 70–99)
Glucose-Capillary: 199 mg/dL — ABNORMAL HIGH (ref 70–99)
Glucose-Capillary: 234 mg/dL — ABNORMAL HIGH (ref 70–99)
Glucose-Capillary: 96 mg/dL (ref 70–99)

## 2021-07-13 LAB — OSMOLALITY: Osmolality: 371 mOsm/kg (ref 275–295)

## 2021-07-13 LAB — TRIGLYCERIDES: Triglycerides: 100 mg/dL (ref <150)

## 2021-07-13 MED ORDER — GLYCOPYRROLATE 0.2 MG/ML IJ SOLN
0.1000 mg | Freq: Once | INTRAMUSCULAR | Status: AC
  Administered 2021-07-13: 0.1 mg via INTRAVENOUS
  Filled 2021-07-13: qty 1

## 2021-07-13 MED ORDER — MORPHINE 100MG IN NS 100ML (1MG/ML) PREMIX INFUSION
5.0000 mg/h | INTRAVENOUS | Status: DC
  Administered 2021-07-13: 5 mg/h via INTRAVENOUS
  Filled 2021-07-13: qty 100

## 2021-07-13 MED ORDER — LABETALOL HCL 5 MG/ML IV SOLN
0.5000 mg/min | Status: DC
  Filled 2021-07-13: qty 200

## 2021-07-13 MED ORDER — MIDAZOLAM HCL 2 MG/2ML IJ SOLN: 2.0000 mg | INTRAMUSCULAR | Status: DC | PRN

## 2021-07-21 NOTE — Progress Notes (Signed)
SLP Cancellation Note  Patient Details Name: Katherine Hardin MRN: [id number] DOB: 07/06/1976   Cancelled treatment:       Reason Eval/Treat Not Completed: Spoke with RN who reports pt intubated with significant change in medical status. SLP will s/o.    Avie Echevaria, MA, CCC-SLP Acute Rehabilitation Services Office Number: [phone]  [name] 06/22/2021, 9:22 AM

## 2021-07-21 NOTE — Progress Notes (Signed)
STROKE TEAM PROGRESS NOTE   INTERVAL HISTORY Multiple family members are at the bedside. Pt went for second minimally invasive surgery yesterday but overnight, pt condition worsened, now pupil dilated, fixed and lost brainstem reflexes. However, pt still able to trigger vent in a low rate. Dr. Merrily Pew discussed with family and they would like to have one way extubation once they are ready.   Vitals:   07/07/2021 0700 07/06/2021 0800 07/16/2021 0806 06/28/2021 0900  BP: (!) 105/58 (!) 102/53 (!) 119/58 (!) 104/49  Pulse: 82 79  77  Resp: 18 18  18   Temp:  98.7 F (37.1 C)    TempSrc:  Axillary    SpO2: 95% 93% 95% 100%  Weight:    110 kg  Height:    5\' 9"  (1.753 m)   CBC:  Recent Labs  Lab 07/24/21 1254 07/24/21 1303 06/26/2021 0559 06/28/2021 0514  WBC 14.7*   < > 13.8* 9.0  NEUTROABS 8.7*  --   --   --   HGB 12.9   < > 10.0* 8.9*  HCT 42.1   < > 34.2* 31.1*  MCV 82.2   < > 86.8 88.9  PLT 323   < > 192 172   < > = values in this interval not displayed.   Basic Metabolic Panel:  Recent Labs  Lab 07/24/21 1611 2021-07-24 1851 06/26/2021 2255 07/11/2021 0514  NA 139   < > 154* 168*  K  --    < > 3.5 3.4*  CL  --    < > >130* >130*  CO2  --    < > 20* 22  GLUCOSE  --    < > 244* 211*  BUN  --    < > 31* 30*  CREATININE  --    < > 1.51* 1.47*  CALCIUM  --    < > 7.7* 8.9  MG 2.1  --   --   --   PHOS 3.3  --   --   --    < > = values in this interval not displayed.   Lipid Panel:  Recent Labs  Lab 07/06/2021 0659 06/26/2021 0514  CHOL 177  --   TRIG 180* 100  HDL 37*  --   CHOLHDL 4.8  --   VLDL 36  --   LDLCALC 104*  --    HgbA1c:  Recent Labs  Lab Jul 24, 2021 1611  HGBA1C 8.2*   Urine Drug Screen:  Recent Labs  Lab 2021/07/24 1631  LABOPIA NONE DETECTED  COCAINSCRNUR NONE DETECTED  LABBENZ NONE DETECTED  AMPHETMU NONE DETECTED  THCU NONE DETECTED  LABBARB NONE DETECTED    Alcohol Level  Recent Labs  Lab 07-24-2021 1603  ETH <10    IMAGING past 24 hours CT HEAD WO  CONTRAST (06/27/2021)  Result Date: 06/28/2021 CLINICAL DATA:  Cerebral hemorrhage suspected f/u ICH evacuation EXAM: CT HEAD WITHOUT CONTRAST TECHNIQUE: Contiguous axial images were obtained from the base of the skull through the vertex without intravenous contrast. COMPARISON:  Earlier same day FINDINGS: Brain: Large area of hemorrhage is again identified with involvement of the right basal ganglia, adjacent white matter, and thalamus. There is decreased hyperdense hemorrhage posteriorly reflecting interval re-evacuation. Of note, there is new small volume hemorrhage and air along the posterior approach surgical tract. Intraventricular hemorrhage is again identified similar to prior study. Areas of cytotoxic edema are again identified in the parieto-occipital lobes bilaterally. There is likely also some involvement of the  frontal lobes. Leftward midline shift measures approximately 8 mm (previously 7 mm). Ventricle caliber is similar. Sulcal effacement is similar. Basal cisterns are similar in appearance. Vascular: No hyperdense vessel or unexpected calcification. Skull: Right parietal burr hole. Sinuses/Orbits: No acute finding. Other: None. IMPRESSION: Decreased hemorrhage posteriorly post repeat evacuation. New small volume hemorrhage along the surgical tract. Similar intraventricular hemorrhage. Areas of evolving recent infarction involving the parieto-occipital lobes and likely frontal lobes as well. Similar mass effect with leftward midline shift, sulcal effacement, and mild trapping of the left lateral ventricle. Electronically Signed   By: Guadlupe Spanish M.D.   On: Aug 09, 2021 15:45   CT BRAINLAB HEAD W/O CONTRAST ( )  Addendum Date: 07/03/2021   ADDENDUM REPORT: 07/11/2021 00:58 ADDENDUM: After further review, there is increased mass effect on the fourth ventricle, which has decreased in size, and increasing effacement of the basal cisterns, quadrigeminal cistern, and prepontine cistern, with findings  concerning for uncal herniation. Discussed with Dr. Iver Nestle at 12:45 am 06/28/2021. Electronically Signed   By: Wiliam Ke M.D.   On: 07/07/2021 00:58   Result Date: 07/10/2021 CLINICAL DATA:  Assess brain bleed EXAM: CT HEAD WITHOUT CONTRAST TECHNIQUE: Contiguous axial images were obtained from the base of the skull through the vertex without intravenous contrast. COMPARISON:  August 09, 2021 2:32 p.m., 5:26 a.m., 1:15 a.m. FINDINGS: Brain: Redemonstrated large area of hemorrhage involving the right basal ganglia, right thalamus, and adjacent white matter, which appear overall unchanged compared to the prior study. Slightly decreased hemorrhage in the posterior approach surgical track. Intraventricular hemorrhage also appears unchanged. No new areas of hemorrhage. Redemonstrated areas of cytotoxic edema in the parieto-occipital lobes and frontal lobes bilaterally. 8 mm of right-to-left midline shift, unchanged. Unchanged effacement of the right lateral ventricle, and likely entrapment of the left lateral ventricle, without evidence hydrocephalus. Unchanged sulcal effacement. Likely unchanged effacement of the basal cisterns. Vascular: No hyperdense vessel. Skull: Right parietal burr hole.  Anterior facet joint Sinuses/Orbits: Mild mucosal thickening in the ethmoid air cells and left sphenoid sinus. The orbits are unremarkable. Other: Trace fluid in right mastoid air cells. IMPRESSION: 1. Overall unchanged appearance of the right basal ganglia and thalamic intraparenchymal hemorrhages, bilateral areas of cytotoxic edema, mass effect, and midline shift. 2. Slightly decreased hemorrhage in the right posterior surgical tract. 3. Unchanged intraventricular hemorrhage, with redemonstrated effacement of the right lateral ventricle and mild entrapment of the left lateral ventricle. Electronically Signed: By: Wiliam Ke M.D. On: 07/12/2021 00:35    Physical Exam   Temp:  [98.7 F (37.1 C)-100.4 F (38 C)] 98.7 F  (37.1 C) (09/23 0800) Pulse Rate:  [51-105] 77 (09/23 0900) Resp:  [14-25] 18 (09/23 0900) BP: (100-174)/(49-87) 104/49 (09/23 0900) SpO2:  [88 %-100 %] 100 % (09/23 0900) Arterial Line BP: (110-190)/(48-101) 110/48 (09/23 0900) FiO2 (%):  [60 %] 60 % (09/23 0806) Weight:  [110 kg] 110 kg (09/23 0900)  General - Well nourished, well developed, intubated off sedation.  Ophthalmologic - fundi not visualized due to noncooperation.  Cardiovascular - Regular rate and rhythm.  Neuro - intubated off sedation, not responsive, in deep coma, not following commands. With forced eye opening, eyes in mid position, dilated and fixed. Corneal reflex absent, gag and cough absent, breathing not over the vent with current RR setting.  Facial symmetry not able to test due to ET tube.  Tongue protrusion not cooperative. On pain stimulation, not moving all extremities. Sensation, coordination and gait not tested.   ASSESSMENT/PLAN Ms. Katherine Estabrook  Hardin is a 45 y.o. female female with an unknown PMHx as only note in Epic is a delivery 10 years ago. Patient was brought in by EMS as a code stroke. Patient was working at [employer] today, when at 1210, a co worker witnessed her slumping over. Patient had aphasia, facial droop and left neglect with weakness. BP 290/150. CBG 211. She vomited in route as well and was unresponsive when arrived. After brief exam at the ED bridge, patient was taken to ED 8 and intubated emergently. Propofol was started after intubation. Cleviprex was started after bleed confirmed.   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  CT showed large right BG ICH with IVH, midline shift around 9 mm.   CTA head and neck no AVM or aneurysm.   CT repeat stable large right BG ICH and midline shift.   MRI large right ICH, no underlying malignancy, midline shift around 8 mm.   CT head 9/22 a.m. slight decrease size of hematoma, overall unchanged mass-effect and midline shift CT head 9/22  p.m. increased mass effect on the fourth ventricle, which has decreased in size, and increasing effacement of the basal cisterns, quadrigeminal cistern, and prepontine cistern, with findings concerning for uncal herniation. EF 65 to 70%, severe LV hypertrophy.   A1c 8.2 LDL 104    UDS negative VTE prophylaxis - SCD's aspirin 81 mg daily prior to admission, now on No antithrombotic. D/t ICH Disposition:  extremely poor prognosis, near brain death. Dr. Merrily Pew has discussed with family and would like to have one way extubation once they are ready.  Cerebral edema Brain herniation MRI and CT showed midline shift around 8 mm On 3% saline -> off -> 50cc Na 155->157->155->153->168 S/p minimally invasive hematoma evacuation x2 CT head 9/22 a.m. slight decrease size of hematoma, overall unchanged mass-effect and midline shift CT head 9/22 p.m. Overall unchanged appearance of the right basal ganglia and thalamic intraparenchymal hemorrhages, bilateral areas of cytotoxic edema, mass effect, and midline shift. Slightly decreased hemorrhage in the right posterior surgical tract. increased mass effect on the fourth ventricle, which has decreased in size, and increasing effacement of the basal cisterns, quadrigeminal cistern, and prepontine cistern, with findings concerning for uncal herniation. Lost brainstem reflex except low rate vent triggering events, near brain death.  Respiratory failure Intubated on vent CCM on board Near brain death on exam  Hypertensive emergency Home meds:  none on Cleviprex drip  Currently on clonidine, HCTZ, amlodipine and lisinopril Goal SBP < 160  Hyperlipidemia Home meds:  none LDL 104, goal < 70 Hold off statin for now due to ICH  Diabetes type II Uncontrolled Home meds:  none HgbA1c 8.2, goal < 7.0 Hyperglycemia CBGs SSI On Levemir CCM on board  Other Stroke Risk Factors   Other acute issues Leukocytosis WBC 12.7-13.9-13.8 AKI creatinine 1.00->  1.44 Possible DI with severe hypernatremia - family decided for one way extubation  Hospital day # 4  This patient is critically ill due to large right BG ICH, IVH, cerebral edema, hypertensive emergency, uncontrolled diabetes, respiratory failure and at significant risk of neurological worsening, death form brain herniation, brain death, seizure, DKA, obstructive hydrocephalus. This patient's care requires constant monitoring of vital signs, hemodynamics, respiratory and cardiac monitoring, review of multiple databases, neurological assessment, discussion with family, other specialists and medical decision making of high complexity. I spent 40 minutes of neurocritical care time in the care of this patient.  I discussed with Dr. Johny Drilling CCM.  Marvel Plan, MD PhD Stroke Neurology 06/29/2021 10:16 AM      To contact Stroke Continuity provider, please refer to [email or web address]. After hours, contact General Neurology

## 2021-07-21 NOTE — Final Progress Note (Addendum)
Pt has died at 23. Absence of respiratory effort, no pulse, no heart sound, no electrical activity on monitor. Family at bedside. Death confirmed. Dr Merrily Pew notified of pt's death.    Approximately 90 mL/CC Morphine gtt wasted with TK Johny Drilling RN

## 2021-07-21 NOTE — Progress Notes (Signed)
PT Cancellation/Discharge Note  Patient Details Name: Katherine Hardin MRN: [id number] DOB: February 16, 1976   Cancelled Treatment:    Reason Eval/Treat Not Completed: Other (comment).  PT to sign off.  Family moving towards full comfort measures.   Thanks,  Corinna Capra, PT, DPT  Acute Rehabilitation Ortho Tech Supervisor [phone] pager #[phone] office      [name] 07/10/2021, 11:06 AM

## 2021-07-21 NOTE — Progress Notes (Signed)
Neurosurgery Service Progress Note  Subjective: Continued decline overnight unfortunately   Objective: Vitals:   08-07-2021 0545 2021/08/07 0600 08-07-21 0615 08-07-21 0806  BP: 115/66 113/64 112/63 (!) 119/58  Pulse: 85 84 84   Resp: 18 18 19    Temp:      TempSrc:      SpO2: 95% 94% 95% 95%  Weight:      Height:        Physical Exam: Intubated, no sedation, fixed/dilated, mildly dysconjugate, no corneals / c/g, 0/5 x4  Assessment & Plan: 45 y.o. woman s/p endoscopic ICH evac, post-op CTH w/ subtotal evac and new tract hemorrhage, now w/ clinical cont'd worsening.  -agree with others, unfortunately continued progression, discussed w/ pt's mother at bedside, unsurvivable injury unfortunately  47  08-07-21 8:11 AM

## 2021-07-21 NOTE — Death Summary Note (Signed)
DEATH SUMMARY   Patient Details  Name: Katherine Hardin MRN: [id number] DOB: 1976/04/24  Admission/Discharge Information   Admit Date:  2021-07-31  Date of Death: Date of Death: 08/04/21  Time of Death: Time of Death: Feb 19, 1213  Length of Stay: 4  Referring Physician: No primary care provider on file.   Reason(s) for Hospitalization  ICH  Diagnoses  Preliminary cause of death:  ICH Brain herniation Cerebral edema  Secondary Diagnoses (including complications and co-morbidities):  Active Problems:   Hypertensive emergency   Respiratory failure (HCC)   S/p minimally invasive hematoma evacuation    Hypokalemia   Hyperglycemia   DM   Leukocytosis   AKI   DI      Brief Hospital Course (including significant findings, care, treatment, and services provided and events leading to death)  Katherine Hardin is a 45 y.o. year old female with an unknown PMHx as only note in Epic is a delivery 10 years ago. Patient was brought in by EMS as a code stroke. Patient was working at [employer] today, when at 1210, a co worker witnessed her slumping over. Patient had aphasia, facial droop and left neglect with weakness. BP 290/150. CBG 211. She vomited in route as well and was unresponsive when arrived. After brief exam at the ED bridge, patient was taken to ED 8 and intubated emergently. Propofol was started after intubation. Cleviprex was started after bleed confirmed.    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  CT showed large right BG ICH with IVH, midline shift around 9 mm.   CTA head and neck no AVM or aneurysm.   CT repeat stable large right BG ICH and midline shift.   MRI large right ICH, no underlying malignancy, midline shift around 8 mm.   CT head 9/22 a.m. slight decrease size of hematoma, overall unchanged mass-effect and midline shift CT head 9/22 p.m. increased mass effect on the fourth ventricle, which has decreased in size, and increasing effacement of the basal  cisterns, quadrigeminal cistern, and prepontine cistern, with findings concerning for uncal herniation. EF 65 to 70%, severe LV hypertrophy.   A1c 8.2 LDL 104    UDS negative VTE prophylaxis - SCD's aspirin 81 mg daily prior to admission, now on No antithrombotic. D/t ICH Disposition:  extremely poor prognosis, near brain death, family requested one way extubation and pt expired at 77.   Cerebral edema Brain herniation MRI and CT showed midline shift around 8 mm On 3% saline -> off -> 50cc Na 155->157->155->153->168 S/p minimally invasive hematoma evacuation x2 CT head 9/22 a.m. slight decrease size of hematoma, overall unchanged mass-effect and midline shift CT head 9/22 p.m. Overall unchanged appearance of the right basal ganglia and thalamic intraparenchymal hemorrhages, bilateral areas of cytotoxic edema, mass effect, and midline shift. Slightly decreased hemorrhage in the right posterior surgical tract. increased mass effect on the fourth ventricle, which has decreased in size, and increasing effacement of the basal cisterns, quadrigeminal cistern, and prepontine cistern, with findings concerning for uncal herniation.   Respiratory failure Intubated on vent   Hypertensive emergency Home meds:  none on Cleviprex drip  Was on clonidine, HCTZ, amlodipine and lisinopril Goal SBP was < 160   Hyperlipidemia Home meds:  none LDL 104, goal < 70 Hold off statin due to ICH   Diabetes type II Uncontrolled Home meds:  none HgbA1c 8.2, goal < 7.0 Hyperglycemia Was On Levemir   Other Stroke Risk Factors     Other  acute issues Leukocytosis WBC 12.7-13.9-13.8 AKI creatinine 1.00-> 1.44 Possible DI with severe hypernatremia    Pertinent Labs and Studies  Significant Diagnostic Studies CT HEAD WO CONTRAST ( )  Result Date: 07/19/2021 CLINICAL DATA:  Cerebral hemorrhage suspected f/u ICH evacuation EXAM: CT HEAD WITHOUT CONTRAST TECHNIQUE: Contiguous axial images were  obtained from the base of the skull through the vertex without intravenous contrast. COMPARISON:  Earlier same day FINDINGS: Brain: Large area of hemorrhage is again identified with involvement of the right basal ganglia, adjacent white matter, and thalamus. There is decreased hyperdense hemorrhage posteriorly reflecting interval re-evacuation. Of note, there is new small volume hemorrhage and air along the posterior approach surgical tract. Intraventricular hemorrhage is again identified similar to prior study. Areas of cytotoxic edema are again identified in the parieto-occipital lobes bilaterally. There is likely also some involvement of the frontal lobes. Leftward midline shift measures approximately 8 mm (previously 7 mm). Ventricle caliber is similar. Sulcal effacement is similar. Basal cisterns are similar in appearance. Vascular: No hyperdense vessel or unexpected calcification. Skull: Right parietal burr hole. Sinuses/Orbits: No acute finding. Other: None. IMPRESSION: Decreased hemorrhage posteriorly post repeat evacuation. New small volume hemorrhage along the surgical tract. Similar intraventricular hemorrhage. Areas of evolving recent infarction involving the parieto-occipital lobes and likely frontal lobes as well. Similar mass effect with leftward midline shift, sulcal effacement, and mild trapping of the left lateral ventricle. Electronically Signed   By: Guadlupe Spanish M.D.   On: 07/07/2021 15:45   CT HEAD WO CONTRAST ( )  Result Date: 07/06/2021 CLINICAL DATA:  Cerebral hemorrhage EXAM: CT HEAD WITHOUT CONTRAST TECHNIQUE: Contiguous axial images were obtained from the base of the skull through the vertex without intravenous contrast. COMPARISON:  06/21/2021. FINDINGS: Brain: New area of intraparenchymal hemorrhage in the medial right thalamus, measuring approximately 2.4 x 1.5 x 1.1 cm (series 3, image 15 and series 6, image 22), with mild surrounding edema. Redemonstrated right parenchymal  hemorrhage, measuring 6.4 x 3.1 x 4.4 cm, previously 6.8 x 3.0 x 4.4 cm when remeasured similarly. Redemonstrated effacement of the right lateral ventricle. Redemonstrated hemorrhage in the lateral ventricles and fourth ventricle slightly increased prominence of the left lateral ventricle. Unchanged 8 mm of right-to-left midline shift. Overall unchanged surrounding edema. The cerebellar tonsils remain above the foramen magnum. No new area of hemorrhage no extra-axial collection. Vascular: No hyperdense vessel or unexpected calcification. Skull: Interval placement of a right parietal burr hole. Sinuses/Orbits: No acute finding. Other: None IMPRESSION: 1. New area of intraparenchymal hemorrhage in the medial right thalamus, with mild surrounding edema. 2. Overall stable to slightly decreased size of previously noted right basal ganglia intraparenchymal hemorrhage. Overall unchanged mass effect and midline shift. 3. Intraventricular hemorrhage with increased prominence of the left lateral ventricle. Attention on follow-up for worsening hydrocephalus. These results were called by telephone at the time of interpretation on 06/30/2021 at 3:34 am to provider [facility] , who verbally acknowledged these results. Electronically Signed   By: Wiliam Ke M.D.   On: 06/24/2021 03:38   CT HEAD WO CONTRAST  Result Date: 07/07/2021 CLINICAL DATA:  Follow-up intracranial hemorrhage. EXAM: CT HEAD WITHOUT CONTRAST TECHNIQUE: Contiguous axial images were obtained from the base of the skull through the vertex without intravenous contrast. COMPARISON:  Earlier CT dated 07/08/2021. FINDINGS: Brain: Large intraparenchymal hemorrhage centered at right basal ganglia measuring 7.2 x 3.3 cm (previously 7.0 x 3.8 cm by my measurements). There is extension of the bleed into the ventricular system with involvement  of the right lateral ventricle. Small amount of blood is also noted layering in the occipital horn of the left lateral  ventricle. There is blood within the fourth ventricle. There is mass effect and compression of the right lateral ventricle. There is proximally 9 mm right to left midline shift. No transtentorial herniation. Vascular: No hyperdense vessel or unexpected calcification. Skull: Normal. Negative for fracture or focal lesion. Sinuses/Orbits: No acute finding. Other: None IMPRESSION: Overall similar or slight interval increase in size of the right basal ganglia intraparenchymal hemorrhage with extension into the ventricular system. There is mass effect and compression of the right lateral ventricle with proximally 9 mm right to left midline shift. Continued close follow-up recommended. Electronically Signed   By: Elgie Collard M.D.   On: 07/08/2021 20:16   MR BRAIN WO CONTRAST  Result Date: 07/10/2021 CLINICAL DATA:  Stroke, follow-up. EXAM: MRI HEAD WITHOUT CONTRAST TECHNIQUE: Multiplanar, multiecho pulse sequences of the brain and surrounding structures were obtained without intravenous contrast. COMPARISON:  Prior head CT examinations 07/10/2021. CT angiogram head/neck 07/08/2021. FINDINGS: Brain: Cerebral volume is normal. Redemonstrated acute parenchymal hemorrhage centered within the right basal ganglia. The parenchymal hemorrhage measures 7.4 x 3.4 x 5.5 cm (AP x TV x CC), not significantly changed in size from the head CT performed yesterday at 7:58 p.m. (remeasured on prior). As before, there is extension of blood products into the lateral, third and fourth ventricles. Intraventricular blood volume does not appear significantly changed. Edema surrounding the parenchymal hemorrhage with partial effacement of the right lateral ventricle. No evidence of ventricular entrapment. 8 mm leftward midline shift measured at the level of the septum pellucidum, unchanged. There is early right uncal herniation with subtle mass effect upon the right aspect of the brainstem (series 5, image 10). No acute ischemic infarct.  No appreciable intracranial mass. No cortical encephalomalacia is identified. No significant cerebral white matter disease. 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 Vascular: Maintained flow voids within the proximal large arterial vessels. Skull and upper cervical spine: No focal suspicious marrow lesion. Sinuses/Orbits: Visualized orbits show no acute finding. No significant paranasal sinus disease. Impression #2 will be called to the ordering clinician or representative by the Radiologist Assistant, and communication documented in the PACS or [employer]. IMPRESSION: 7.4 x 3.4 x 5.5 cm acute parenchymal hemorrhage centered within the right basal ganglia, not significantly changed in size as compared to the head CT performed yesterday at 7:58 p.m. Unchanged extension of blood products into the lateral, third and fourth ventricles. No evidence of hydrocephalus or ventricular entrapment at this time. Unchanged mass effect with partial effacement of the right lateral ventricle, and 8 mm leftward midline shift measured at the level of the septum pellucidum. There is early right uncal herniation with subtle mass effect upon the right aspect of the brainstem. 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 Electronically Signed   By: Jackey Loge D.O.   On: 07/10/2021 15:39   Portable Chest xray  Result Date: 07/10/2021 CLINICAL DATA:  Endotracheal tube. EXAM: PORTABLE CHEST 1 VIEW COMPARISON:  July 09, 2021. FINDINGS: Stable cardiomegaly. Endotracheal and nasogastric tubes are in good position. Right-sided PICC line is noted with tip in expected position of right atrium. Hypoinflation of the lungs is noted with mild bibasilar subsegmental atelectasis. Bony thorax is unremarkable. IMPRESSION: Right-sided PICC line is noted with tip in expected position of right  atrium. Hypoinflation of  the lungs is noted with mild bibasilar subsegmental atelectasis. Electronically Signed   By: Lupita Raider M.D.   On: 07/10/2021 08:27   DG Chest Portable 1 View  Result Date: 07/15/2021 CLINICAL DATA:  post intubation EXAM: PORTABLE CHEST 1 VIEW COMPARISON:  None. FINDINGS: Endotracheal tube with tip terminating approximately 2 cm of the carina. Hypoventilatory exam. The heart appears enlarged, which may be projectional. No large pleural effusion. No lobar consolidation. No acute osseous abnormality. IMPRESSION: Endotracheal tube terminating within 2 cm of the carina. No focal pulmonary process. Electronically Signed   By: Olive Bass M.D.   On: 07/08/2021 14:17   ECHOCARDIOGRAM COMPLETE  Result Date: 07/10/2021    ECHOCARDIOGRAM REPORT   Patient Name:   Katherine Hardin Ponder Date of Exam: 07/10/2021 Medical Rec #:  [id number]       Height: Accession #:    [id number]      Weight:       242.5 lb Date of Birth:  1976-03-28      BSA:          2.296 m Patient Age:    44 years        BP:           130/67 mmHg Patient Gender: F               HR:           89 bpm. Exam Location:  Inpatient Procedure: 2D Echo, Cardiac Doppler and Color Doppler Indications:    Stroke  History:        Patient has no prior history of Echocardiogram examinations.  Sonographer:    Ross Ludwig RDCS (AE) Referring Phys: [id number] [facility]  Sonographer Comments: Patient is morbidly obese, no subcostal window and echo performed with patient supine and on artificial respirator. IMPRESSIONS  1. Left ventricular ejection fraction, by estimation, is 65 to 70%. The left ventricle has normal function. The left ventricle has no regional wall motion abnormalities. There is severe concentric left ventricular hypertrophy. Left ventricular diastolic  parameters are consistent with Grade II diastolic dysfunction (pseudonormalization). Elevated left ventricular end-diastolic pressure.  2. Right ventricular systolic function  is normal. The right ventricular size is normal. Tricuspid regurgitation signal is inadequate for assessing PA pressure.  3. Left atrial size was mild to moderately dilated.  4. The mitral valve is normal in structure. No evidence of mitral valve regurgitation. No evidence of mitral stenosis.  5. The aortic valve is normal in structure. Aortic valve regurgitation is not visualized. No aortic stenosis is present.  6. The inferior vena cava is normal in size with <50% respiratory variability, suggesting right atrial pressure of 8 mmHg. FINDINGS  Left Ventricle: Left ventricular ejection fraction, by estimation, is 65 to 70%. The left ventricle has normal function. The left ventricle has no regional wall motion abnormalities. The left ventricular internal cavity size was normal in size. There is  severe concentric left ventricular hypertrophy. Left ventricular diastolic parameters are consistent with Grade II diastolic dysfunction (pseudonormalization). Elevated left ventricular end-diastolic pressure. Right Ventricle: The right ventricular size is normal. No increase in right ventricular wall thickness. Right ventricular systolic function is normal. Tricuspid regurgitation signal is inadequate for assessing PA pressure. Left Atrium: Left atrial size was mild to moderately dilated. Right Atrium: Right atrial size was normal in size. Pericardium: There is no evidence of pericardial effusion. Mitral Valve: The mitral valve is normal in structure. Mild mitral annular calcification. No evidence of  mitral valve regurgitation. No evidence of mitral valve stenosis. Tricuspid Valve: The tricuspid valve is normal in structure. Tricuspid valve regurgitation is not demonstrated. No evidence of tricuspid stenosis. Aortic Valve: The aortic valve is normal in structure. Aortic valve regurgitation is not visualized. No aortic stenosis is present. Aortic valve mean gradient measures 8.0 mmHg. Aortic valve peak gradient measures 16.0  mmHg. Aortic valve area, by VTI measures 3.43 cm. Pulmonic Valve: The pulmonic valve was normal in structure. Pulmonic valve regurgitation is not visualized. No evidence of pulmonic stenosis. Aorta: The aortic root is normal in size and structure. Venous: The inferior vena cava is normal in size with less than 50% respiratory variability, suggesting right atrial pressure of 8 mmHg. IAS/Shunts: No atrial level shunt detected by color flow Doppler.  LEFT VENTRICLE PLAX 2D LVIDd:         4.20 cm  Diastology LVIDs:         2.60 cm  LV e' medial:    7.29 cm/s LV PW:         1.90 cm  LV E/e' medial:  11.3 LV IVS:        1.60 cm  LV e' lateral:   4.90 cm/s LVOT diam:     2.30 cm  LV E/e' lateral: 16.8 LV SV:         117 LV SV Index:   51 LVOT Area:     4.15 cm  IVC IVC diam: 1.90 cm LEFT ATRIUM             Index       RIGHT ATRIUM           Index LA diam:        4.60 cm 2.00 cm/m  RA Area:     15.10 cm LA Vol (A2C):   75.3 ml 32.79 ml/m RA Volume:   35.40 ml  15.42 ml/m LA Vol (A4C):   99.2 ml 43.20 ml/m LA Biplane Vol: 94.8 ml 41.29 ml/m  AORTIC VALVE AV Area (Vmax):    3.26 cm AV Area (Vmean):   3.50 cm AV Area (VTI):     3.43 cm AV Vmax:           200.00 cm/s AV Vmean:          134.000 cm/s AV VTI:            0.342 m AV Peak Grad:      16.0 mmHg AV Mean Grad:      8.0 mmHg LVOT Vmax:         157.00 cm/s LVOT Vmean:        113.000 cm/s LVOT VTI:          0.282 m LVOT/AV VTI ratio: 0.82  AORTA Ao Root diam: 3.20 cm Ao Asc diam:  3.10 cm MITRAL VALVE MV Area (PHT): 3.89 cm    SHUNTS MV Decel Time: 195 msec    Systemic VTI:  0.28 m MV E velocity: 82.50 cm/s  Systemic Diam: 2.30 cm MV A velocity: 81.00 cm/s MV E/A ratio:  1.02 Armanda Magic MD Electronically signed by Armanda Magic MD Signature Date/Time: 07/10/2021/11:32:24 AM    Final    CT HEAD CODE STROKE WO CONTRAST  Result Date: 07/15/2021 CLINICAL DATA:  Code stroke. Neurological deficit. Acute stroke suspected. EXAM: CT HEAD WITHOUT CONTRAST TECHNIQUE:  Contiguous axial images were obtained from the base of the skull through the vertex without intravenous contrast. COMPARISON:  None. FINDINGS: Brain: 6.5 x  3.7 x 5.7 cm (volume = 72 cm^3) acute intraparenchymal hemorrhage with the epicenter in the right basal ganglia. Intraventricular penetration into the right lateral ventricle with extension to the third and fourth ventricles. Mass effect with flattening of the right lateral ventricle and right-to-left midline shift of 9 mm. The remainder the brain is normal. No sign of previous stroke. No extra-axial collection. Vascular: No vascular finding otherwise. Skull: Normal Sinuses/Orbits: Clear/normal Other: None IMPRESSION: 1. Acute infract kummel hemorrhage with the epicenter in the right basal ganglia, estimated volume 72 cc. Intraventricular penetration into the right lateral ventricle with extension to the third and fourth ventricles. Mass effect as described. 2. These results were called by telephone at the time of interpretation on 07-25-21 at 1:21 pm to provider [facility] , who verbally acknowledged these results. Electronically Signed   By: Paulina Fusi M.D.   On: 2021-07-25 13:23   [place] EKG SITE RITE  Result Date: 25-Jul-2021 If Site Rite image not attached, placement could not be confirmed due to current cardiac rhythm.  CT ANGIO HEAD CODE STROKE  Result Date: July 25, 2021 CLINICAL DATA:  Stroke follow-up EXAM: CT ANGIOGRAPHY HEAD AND NECK TECHNIQUE: Multidetector CT imaging of the head and neck was performed using the standard protocol during bolus administration of intravenous contrast. Multiplanar CT image reconstructions and MIPs were obtained to evaluate the vascular anatomy. Carotid stenosis measurements (when applicable) are obtained utilizing NASCET criteria, using the distal internal carotid diameter as the denominator. CONTRAST:  101mL OMNIPAQUE IOHEXOL 350 MG/ML SOLN COMPARISON:  Noncontrast CT head obtained earlier the same day FINDINGS:  CTA NECK FINDINGS Aortic arch: The aortic arch is unremarkable. Right carotid system: No evidence of dissection, stenosis (50% or greater) or occlusion. Left carotid system: No evidence of dissection, stenosis (50% or greater) or occlusion. Vertebral arteries: Codominant. No evidence of dissection, stenosis (50% or greater) or occlusion. Skeleton: There is mild degenerative change of the cervical spine. There is no acute osseous abnormality or aggressive osseous lesion. Other neck: An endotracheal tube is in place. The tip is not imaged. The lung apices are clear. Upper chest: The soft tissues are unremarkable. Review of the MIP images confirms the above findings CTA HEAD FINDINGS Anterior circulation: The bilateral intracranial ICAs are patent. The bilateral MCAs and ACAs are patent. There is no aneurysm or AVM. Posterior circulation: The V4 segments of the vertebral arteries are patent. The basilar artery is patent. The bilateral PCAs are patent. There is no aneurysm or AVM. The posterior communicating arteries are not definitely identified. Venous sinuses: As permitted by contrast timing, patent. Anatomic variants: None. There is no evidence of active extravasation. The large intraparenchymal hematoma centered in the right basal ganglia with intraventricular extension into the right lateral ventricle is unchanged. Leftward midline shift is not significantly changed. Review of the MIP images confirms the above findings IMPRESSION: 1. Patent vasculature of the head and neck. No aneurysm or AVM identified. 2. Overall stable large intraparenchymal hematoma centered in the right basal ganglia with unchanged leftward midline shift. No evidence of active extravasation into the hematoma. Electronically Signed   By: Lesia Hausen M.D.   On: 2021-07-25 13:56   CT ANGIO NECK CODE STROKE  Result Date: 25-Jul-2021 CLINICAL DATA:  Stroke follow-up EXAM: CT ANGIOGRAPHY HEAD AND NECK TECHNIQUE: Multidetector CT imaging of the  head and neck was performed using the standard protocol during bolus administration of intravenous contrast. Multiplanar CT image reconstructions and MIPs were obtained to evaluate the vascular anatomy. Carotid  stenosis measurements (when applicable) are obtained utilizing NASCET criteria, using the distal internal carotid diameter as the denominator. CONTRAST:  75mL OMNIPAQUE IOHEXOL 350 MG/ML SOLN COMPARISON:  Noncontrast CT head obtained earlier the same day FINDINGS: CTA NECK FINDINGS Aortic arch: The aortic arch is unremarkable. Right carotid system: No evidence of dissection, stenosis (50% or greater) or occlusion. Left carotid system: No evidence of dissection, stenosis (50% or greater) or occlusion. Vertebral arteries: Codominant. No evidence of dissection, stenosis (50% or greater) or occlusion. Skeleton: There is mild degenerative change of the cervical spine. There is no acute osseous abnormality or aggressive osseous lesion. Other neck: An endotracheal tube is in place. The tip is not imaged. The lung apices are clear. Upper chest: The soft tissues are unremarkable. Review of the MIP images confirms the above findings CTA HEAD FINDINGS Anterior circulation: The bilateral intracranial ICAs are patent. The bilateral MCAs and ACAs are patent. There is no aneurysm or AVM. Posterior circulation: The V4 segments of the vertebral arteries are patent. The basilar artery is patent. The bilateral PCAs are patent. There is no aneurysm or AVM. The posterior communicating arteries are not definitely identified. Venous sinuses: As permitted by contrast timing, patent. Anatomic variants: None. There is no evidence of active extravasation. The large intraparenchymal hematoma centered in the right basal ganglia with intraventricular extension into the right lateral ventricle is unchanged. Leftward midline shift is not significantly changed. Review of the MIP images confirms the above findings IMPRESSION: 1. Patent  vasculature of the head and neck. No aneurysm or AVM identified. 2. Overall stable large intraparenchymal hematoma centered in the right basal ganglia with unchanged leftward midline shift. No evidence of active extravasation into the hematoma. Electronically Signed   By: Lesia Hausen M.D.   On: 06/25/2021 13:56   CT BRAINLAB HEAD W/O CONTRAST ( )  Addendum Date: 07/02/2021   ADDENDUM REPORT: 07/01/2021 00:58 ADDENDUM: After further review, there is increased mass effect on the fourth ventricle, which has decreased in size, and increasing effacement of the basal cisterns, quadrigeminal cistern, and prepontine cistern, with findings concerning for uncal herniation. Discussed with Dr. Iver Nestle at 12:45 am 07/05/2021. Electronically Signed   By: Wiliam Ke M.D.   On: 06/25/2021 00:58   Result Date: 06/24/2021 CLINICAL DATA:  Assess brain bleed EXAM: CT HEAD WITHOUT CONTRAST TECHNIQUE: Contiguous axial images were obtained from the base of the skull through the vertex without intravenous contrast. COMPARISON:  06/23/2021 2:32 p.m., 5:26 a.m., 1:15 a.m. FINDINGS: Brain: Redemonstrated large area of hemorrhage involving the right basal ganglia, right thalamus, and adjacent white matter, which appear overall unchanged compared to the prior study. Slightly decreased hemorrhage in the posterior approach surgical track. Intraventricular hemorrhage also appears unchanged. No new areas of hemorrhage. Redemonstrated areas of cytotoxic edema in the parieto-occipital lobes and frontal lobes bilaterally. 8 mm of right-to-left midline shift, unchanged. Unchanged effacement of the right lateral ventricle, and likely entrapment of the left lateral ventricle, without evidence hydrocephalus. Unchanged sulcal effacement. Likely unchanged effacement of the basal cisterns. Vascular: No hyperdense vessel. Skull: Right parietal burr hole.  Anterior facet joint Sinuses/Orbits: Mild mucosal thickening in the ethmoid air cells and left  sphenoid sinus. The orbits are unremarkable. Other: Trace fluid in right mastoid air cells. IMPRESSION: 1. Overall unchanged appearance of the right basal ganglia and thalamic intraparenchymal hemorrhages, bilateral areas of cytotoxic edema, mass effect, and midline shift. 2. Slightly decreased hemorrhage in the right posterior surgical tract. 3. Unchanged intraventricular hemorrhage, with redemonstrated effacement  of the right lateral ventricle and mild entrapment of the left lateral ventricle. Electronically Signed: By: Wiliam Ke M.D. On: 06/21/2021 00:35   CT BRAINLAB HEAD W/O CONTRAST ( )  Result Date: 07/06/2021 CLINICAL DATA:  45 year old female code stroke presentation with large right basal ganglia hemorrhage. Postoperative day 1 endoscopic evacuation of hematoma. Right thalamic extension this morning. Subsequent encounter. EXAM: CT HEAD WITHOUT CONTRAST TECHNIQUE: Contiguous axial images were obtained from the base of the skull through the vertex without intravenous contrast. COMPARISON:  0116 hours today and earlier. FINDINGS: Brain: Large oval 70 x 30 x 51 mm (AP by transverse by CC) right basal ganglia hyperdense hemorrhage is stable to mildly decreased since 07/10/21 (estimated blood volume now 54 mL). Regional edema and mass effect not significantly changed from yesterday. Smaller, 22 mm length hyperdense hemorrhage centered in the right thalamus with surrounding edema is stable since 0116 hours today, new from yesterday. Small to moderate volume intraventricular hemorrhage is stable since yesterday. Mild left lateral ventriculomegaly is stable. Leftward midline shift 7 mm is stable since yesterday. But there is increasing cytotoxic edema in the left occipital pole, subtle yesterday and now plainly visible on series 5, image 18 and adjacent images. Also, is new cytotoxic edema in the posteromedial right parietal lobe located above the endoscopic burr hole on series 5, image 29. And there is  similar but more subtle contralateral left posterior parietal lobe edema on image 27. These were subtle at 0116 hours today. Mild edema along the endoscopic course to the intra-axial hemorrhage is stable. Basilar cistern patency has not significantly changed. Vascular: No suspicious intracranial vascular hyperdensity. Skull: Right posterior parietal region burr hole redemonstrated. No acute osseous abnormality identified. Sinuses/Orbits: Visualized paranasal sinuses and mastoids are stable and well aerated. Other: Intubated and oral enteric tube in place. Mild fluid in the pharynx. Visualized orbit soft tissues are within normal limits. Mild postoperative changes right posterior scalp overlying skull burr hole. IMPRESSION: 1. New and increasing cytotoxic edema in both posterior parietal lobes and the left occipital pole, compatible with acute and subacute ischemia. 2. Stable 2.2 cm acute hemorrhage in the right thalamus with surrounding edema since 0116 hours today. 3. Large left basal ganglia, approximately 54 mL hemorrhage is stable to slightly decreased since 07/10/2021. 4. Intracranial mass effect and leftward midline shift (7 mm) are stable. 5. Stable intraventricular hemorrhage and mild trapping of the left lateral ventricle. Electronically Signed   By: Odessa Fleming M.D.   On: 06/26/2021 06:43   CT BRAINLAB HEAD W/O CONTRAST ( )  Result Date: Jul 28, 2021 CLINICAL DATA:  BRAINLAB PROTOCOL.  STROKE.  HEMORRHAGE. EXAM: CT HEAD WITHOUT CONTRAST TECHNIQUE: Contiguous axial images were obtained from the base of the skull through the vertex without intravenous contrast. COMPARISON:  CT HEAD WITHOUT CONTRAST 07/01/2021 AND MR HEAD WITHOUT CONTRAST 07/10/2021 FINDINGS: Brain: Right parenchymal hemorrhage stable in size measuring 6.8 x 4.4 x 3.1 cm. Effacement of the right lateral ventricle again noted. Layering intraventricular hemorrhage again noted. Mild prominence of the left lateral ventricle stable. Midline shift  unchanged. Cerebellar tonsils are above the foramen magnum. Minimal blood is present the fourth ventricle. No new hemorrhage is present. No significant extra-axial fluid is present. Vascular: No hyperdense vessel or unexpected calcification. Skull: Calvarium is intact. No focal lytic or blastic lesions are present. No significant extracranial soft tissue lesion is present. Sinuses/Orbits: The paranasal sinuses and mastoid air cells are clear. The globes and orbits are within normal limits. Other: Endotracheal tube and  orogastric tube are in place. IMPRESSION: 1. Stable size of right parenchymal hemorrhage with mass effect and midline shift. 2. Stable intraventricular hemorrhage. 3. Stable mild prominence of the left lateral ventricle. Electronically Signed   By: Marin Roberts M.D.   On: 06/28/2021 12:04    Microbiology Recent Results (from the past 240 hour(s))  MRSA Next Gen by PCR, Nasal     Status: None   Collection Time: 07/18/2021  4:27 PM   Specimen: Nasal Mucosa; Nasal Swab  Result Value Ref Range Status   MRSA by PCR Next Gen NOT DETECTED NOT DETECTED Final    Comment: (NOTE) The GeneXpert MRSA Assay (FDA approved for NASAL specimens only), is one component of a comprehensive MRSA colonization surveillance program. It is not intended to diagnose MRSA infection nor to guide or monitor treatment for MRSA infections. Test performance is not FDA approved in patients less than 44 years old. Performed at [facility], [address] [place], [place] [address]     Lab Basic Metabolic Panel: Recent Labs  Lab 06/27/2021 1611 07/17/2021 1851 07/10/21 0534 07/10/21 1229 07/08/2021 0659 07/10/2021 1306 07/09/2021 0559 07/20/2021 1546 06/29/2021 2143 07/07/2021 2255 2021-08-08 0514  NA 139   < > 147*   < > 155*   < > 153* 159* 158* 154* 168*  K  --    < > 3.8  --  3.8  --  3.8  --   --  3.5 3.4*  CL  --    < > 115*  --  125*  --  125*  --   --  >130* >130*  CO2  --    < > 24  --  23  --   20*  --   --  20* 22  GLUCOSE  --    < > 188*  --  239*  --  290*  --   --  244* 211*  BUN  --    < > 9  --  20  --  25*  --   --  31* 30*  CREATININE  --    < > 0.86  --  1.00  --  1.44*  --   --  1.51* 1.47*  CALCIUM  --    < > 8.4*  --  9.0  --  8.5*  --   --  7.7* 8.9  MG 2.1  --   --   --   --   --   --   --   --   --   --   PHOS 3.3  --   --   --   --   --   --   --   --   --   --    < > = values in this interval not displayed.   Liver Function Tests: Recent Labs  Lab 07/20/2021 1254  AST 25  ALT 22  ALKPHOS 70  BILITOT 0.8  PROT 7.7  ALBUMIN 4.1   No results for input(s): LIPASE, AMYLASE in the last 168 hours. No results for input(s): AMMONIA in the last 168 hours. CBC: Recent Labs  Lab 07/15/2021 1254 06/29/2021 1303 07/05/2021 1609 07/10/21 0603 07/08/2021 0659 07/19/2021 0559 08-08-21 0514  WBC 14.7*  --   --  12.7* 13.9* 13.8* 9.0  NEUTROABS 8.7*  --   --   --   --   --   --   HGB 12.9   < > 12.2  9.6* 10.0* 10.0* 8.9*  HCT 42.1   < > 36.0 32.5* 33.7* 34.2* 31.1*  MCV 82.2  --   --  85.3 86.2 86.8 88.9  PLT 323  --   --  239 207 192 172   < > = values in this interval not displayed.   Cardiac Enzymes: No results for input(s): CKTOTAL, CKMB, CKMBINDEX, TROPONINI in the last 168 hours. Sepsis Labs: Recent Labs  Lab 07/10/21 0603 07/16/2021 0659 07/10/2021 0559 July 14, 2021 0514  WBC 12.7* 13.9* 13.8* 9.0    Procedures/Operations  Intubation, minimally invasive hematoma evacuation x 2   Kashlynn Kundert 07/14/2021, 12:31 PM

## 2021-07-21 NOTE — Progress Notes (Signed)
OT Cancellation Note  Patient Details Name: MELLINA BENISON MRN: [id number] DOB: 11/29/1975   Cancelled Treatment:    Reason Eval/Treat Not Completed: Other (comment) (Medical decline. OT signing off.)  [facility] Luisa Dago, OT/L   Acute OT Clinical Specialist Acute Rehabilitation Services Pager [phone] Office [phone]  2021/08/05, 8:31 AM

## 2021-07-21 NOTE — Care Plan (Signed)
GOALS OF CARE DISCUSSION   The Clinical status was relayed to patient's family at bedside  in detail.   Updated and notified of patients medical condition.     Patient remains unresponsive and will not open eyes to command.   Explained to family course of therapy and the modalities  Signs of severe brain damage Recommend follow up NEUROLOGY RECS   Patient with Progressive brain herniation and cerebral edema with very high probablity of a very minimal chance of meaningful recovery despite all aggressive and optimal medical therapy.  Patient's family decided to proceed with comfort care considering almost no chances of meaningful recovery  Family are satisfied with Plan of action and management. All questions answered   CC time 25 mins    Cheri Fowler MD [facility] See Amion for pager If no response to pager, please call [phone] until 7pm After 7pm, Please call E-link [phone]

## 2021-07-21 NOTE — Progress Notes (Signed)
Pt extubated to comfort care at this time. RN was at bedside.

## 2021-07-21 NOTE — Progress Notes (Signed)
Pt's husband Onalee Hua, and mother ready for extubation. Morphine gtt has been started. Extubated at 1200.

## 2021-07-21 NOTE — Progress Notes (Signed)
Patient ID: Katherine Hardin, female   DOB: 08/23/1976, 45 y.o.   MRN: [id number]   Patient with declining neurological examination and evidence of herniation on CT. There is no acute neurosurgical intervention to offer the patient at this time.   Benita Gutter, DNP, NP-C

## 2021-07-21 NOTE — Progress Notes (Signed)
Notified by nursing of examination change: Bilateral unreactive pupils.  On examination, patient had bilateral 7 mm pupils, nonreactive, no corneal reflexes, no VOR, initially slight grimace on suctioning but no cough/gag and no movement in any of her extremities to noxious stimulation.  Sodium was noted to be near institutional maximum at 158, therefore only 250 cc of 3% was given On my orders, she was additionally given a full dose of mannitol (110 g) in addition to pursuing hyperventilation for an appropriate time limited trial Per ENLS protocol, a trial of propofol push was also attempted  On repeat examination, there was no change other than loss of the grimace previously seen  Subsequently labs were rechecked  Sodium resulted at 154, BUN 31, glucose 244, measured serum awesome's at 371 with an osmolar gap of 38  Head CT was obtained, personally reviewed with demonstration of uncal herniation  Assessment/impression: - Clinical exam and radiographic exam consistent with herniation - Suspect the slight drop in her sodium is secondary to displacement by mannitol and not accurate, so will not pursue further saline at this time - Osmolar gap of > 20 not felt to achieve clinical benefit, so no further role for mannitol at this time  Plan: - Medical treatment options for increased ICP have been exhausted  - Discussed with neurosurgery on call, who indicated no further neurosurgical intervention at this time - Discussed with mother at bedside extensively.  We discussed the patient's clinical course, reviewed the imaging including head CT from last night compared to the most recent head CT, and discussed that medical and neurosurgical treatment options have been exhausted as above.  She expressed understanding that the patient would not want a highly dependent quality of life, but had some concerns that the patient's husband, who is the patient's medical decision-maker, may have difficulty in  accepting the severity of her condition.  Discussed at this point, the next step would be proceeding to comfort care or performing brain death testing.  If apnea testing were to reveal the patient still had some brainstem function, we discussed that the patient would likely need tracheostomy and PEG tube, and may still progress further to brain death in the next few hours or days.  Patient's mother plans to discuss the events of the night with family herself in the morning and requests a family meeting during the day.  Palliative care consultation was discussed; mother would prefer to hold off on this until she has talked to the family herself - I have discontinued propofol and fentanyl to maximize the patient's neurological examination.  Brooke Dare MD-PhD [facility] [phone]  Available 7 PM to 7 AM, outside of these hours please call Neurologist on call as listed on Amion.  CRITICAL CARE Performed by: Gordy Councilman   Total critical care time: 70 minutes  Critical care time was exclusive of separately billable procedures and treating other patients.  Critical care was necessary to treat or prevent imminent or life-threatening deterioration.  Critical care was time spent personally by me on the following activities: development of treatment plan with patient and/or surrogate as well as nursing, discussions with consultants, evaluation of patient's response to treatment, examination of patient, obtaining history from patient or surrogate, ordering and performing treatments and interventions, ordering and review of laboratory studies, ordering and review of radiographic studies, pulse oximetry and re-evaluation of patient's condition.

## 2021-07-21 NOTE — Progress Notes (Signed)
Drs Roda Shutters and Merrily Pew notified of pt's sodium level of 168.

## 2021-07-21 DEATH — deceased
